# Patient Record
Sex: Male | Born: 1940 | Race: White | Hispanic: No | Marital: Married | Smoking: Former smoker
Health system: Southern US, Community
[De-identification: ages and names within clinical notes are randomized; demographics above are authoritative.]

## PROBLEM LIST (undated history)

## (undated) DIAGNOSIS — E785 Hyperlipidemia, unspecified: Secondary | ICD-10-CM

## (undated) DIAGNOSIS — N189 Chronic kidney disease, unspecified: Secondary | ICD-10-CM

## (undated) DIAGNOSIS — I509 Heart failure, unspecified: Secondary | ICD-10-CM

## (undated) DIAGNOSIS — I10 Essential (primary) hypertension: Secondary | ICD-10-CM

## (undated) DIAGNOSIS — I499 Cardiac arrhythmia, unspecified: Secondary | ICD-10-CM

## (undated) HISTORY — DX: Hyperlipidemia, unspecified: E78.5

## (undated) HISTORY — DX: Heart failure, unspecified: I50.9

## (undated) HISTORY — DX: Cardiac arrhythmia, unspecified: I49.9

## (undated) HISTORY — PX: CHOLECYSTECTOMY: SHX55

## (undated) HISTORY — PX: CATARACT EXTRACTION: SUR2

## (undated) HISTORY — PX: RHINOPLASTY: SUR1284

## (undated) HISTORY — PX: MOUTH SURGERY: SHX715

## (undated) HISTORY — PX: APPENDECTOMY: SHX54

## (undated) HISTORY — DX: Essential (primary) hypertension: I10

## (undated) HISTORY — DX: Chronic kidney disease, unspecified: N18.9

---

## 2001-02-26 DIAGNOSIS — Z860100 Personal history of colon polyps, unspecified: Secondary | ICD-10-CM | POA: Insufficient documentation

## 2002-02-26 HISTORY — PX: CORONARY ARTERY BYPASS GRAFT: SHX141

## 2002-06-22 ENCOUNTER — Ambulatory Visit (HOSPITAL_COMMUNITY): Admission: RE | Admit: 2002-06-22 | Discharge: 2002-06-22 | Payer: Self-pay | Admitting: Ophthalmology

## 2002-06-22 ENCOUNTER — Encounter: Payer: Self-pay | Admitting: Ophthalmology

## 2011-02-28 DIAGNOSIS — Z7901 Long term (current) use of anticoagulants: Secondary | ICD-10-CM | POA: Diagnosis not present

## 2011-03-02 DIAGNOSIS — F329 Major depressive disorder, single episode, unspecified: Secondary | ICD-10-CM | POA: Diagnosis not present

## 2011-03-02 DIAGNOSIS — N529 Male erectile dysfunction, unspecified: Secondary | ICD-10-CM | POA: Diagnosis not present

## 2011-03-02 DIAGNOSIS — E119 Type 2 diabetes mellitus without complications: Secondary | ICD-10-CM | POA: Diagnosis not present

## 2011-03-07 DIAGNOSIS — Z136 Encounter for screening for cardiovascular disorders: Secondary | ICD-10-CM | POA: Diagnosis not present

## 2011-03-07 DIAGNOSIS — I700 Atherosclerosis of aorta: Secondary | ICD-10-CM | POA: Diagnosis not present

## 2011-03-30 DIAGNOSIS — G250 Essential tremor: Secondary | ICD-10-CM | POA: Diagnosis not present

## 2011-03-30 DIAGNOSIS — G252 Other specified forms of tremor: Secondary | ICD-10-CM | POA: Diagnosis not present

## 2011-03-30 DIAGNOSIS — E119 Type 2 diabetes mellitus without complications: Secondary | ICD-10-CM | POA: Diagnosis not present

## 2011-03-30 DIAGNOSIS — Z7901 Long term (current) use of anticoagulants: Secondary | ICD-10-CM | POA: Diagnosis not present

## 2011-03-30 DIAGNOSIS — E669 Obesity, unspecified: Secondary | ICD-10-CM | POA: Diagnosis not present

## 2011-04-11 DIAGNOSIS — Z7901 Long term (current) use of anticoagulants: Secondary | ICD-10-CM | POA: Diagnosis not present

## 2011-04-13 DIAGNOSIS — G250 Essential tremor: Secondary | ICD-10-CM | POA: Diagnosis not present

## 2011-04-13 DIAGNOSIS — Z79899 Other long term (current) drug therapy: Secondary | ICD-10-CM | POA: Diagnosis not present

## 2011-04-13 DIAGNOSIS — Z7901 Long term (current) use of anticoagulants: Secondary | ICD-10-CM | POA: Diagnosis not present

## 2011-04-13 DIAGNOSIS — D539 Nutritional anemia, unspecified: Secondary | ICD-10-CM | POA: Diagnosis not present

## 2011-04-13 DIAGNOSIS — G252 Other specified forms of tremor: Secondary | ICD-10-CM | POA: Diagnosis not present

## 2011-04-19 DIAGNOSIS — Z7901 Long term (current) use of anticoagulants: Secondary | ICD-10-CM | POA: Diagnosis not present

## 2011-04-25 DIAGNOSIS — I6789 Other cerebrovascular disease: Secondary | ICD-10-CM | POA: Diagnosis not present

## 2011-04-25 DIAGNOSIS — R4789 Other speech disturbances: Secondary | ICD-10-CM | POA: Diagnosis not present

## 2011-04-25 DIAGNOSIS — R259 Unspecified abnormal involuntary movements: Secondary | ICD-10-CM | POA: Diagnosis not present

## 2011-04-27 DIAGNOSIS — Z7901 Long term (current) use of anticoagulants: Secondary | ICD-10-CM | POA: Diagnosis not present

## 2011-05-24 DIAGNOSIS — I10 Essential (primary) hypertension: Secondary | ICD-10-CM | POA: Diagnosis not present

## 2011-05-24 DIAGNOSIS — G250 Essential tremor: Secondary | ICD-10-CM | POA: Diagnosis not present

## 2011-05-24 DIAGNOSIS — Z7901 Long term (current) use of anticoagulants: Secondary | ICD-10-CM | POA: Diagnosis not present

## 2011-05-24 DIAGNOSIS — G252 Other specified forms of tremor: Secondary | ICD-10-CM | POA: Diagnosis not present

## 2011-05-24 DIAGNOSIS — F329 Major depressive disorder, single episode, unspecified: Secondary | ICD-10-CM | POA: Diagnosis not present

## 2011-06-01 DIAGNOSIS — Z7901 Long term (current) use of anticoagulants: Secondary | ICD-10-CM | POA: Diagnosis not present

## 2011-06-02 DIAGNOSIS — R079 Chest pain, unspecified: Secondary | ICD-10-CM | POA: Diagnosis not present

## 2011-06-02 DIAGNOSIS — K219 Gastro-esophageal reflux disease without esophagitis: Secondary | ICD-10-CM | POA: Diagnosis not present

## 2011-06-02 DIAGNOSIS — E785 Hyperlipidemia, unspecified: Secondary | ICD-10-CM | POA: Diagnosis present

## 2011-06-02 DIAGNOSIS — E119 Type 2 diabetes mellitus without complications: Secondary | ICD-10-CM | POA: Diagnosis not present

## 2011-06-02 DIAGNOSIS — I119 Hypertensive heart disease without heart failure: Secondary | ICD-10-CM | POA: Diagnosis not present

## 2011-06-02 DIAGNOSIS — I10 Essential (primary) hypertension: Secondary | ICD-10-CM | POA: Diagnosis not present

## 2011-06-02 DIAGNOSIS — Z951 Presence of aortocoronary bypass graft: Secondary | ICD-10-CM | POA: Diagnosis not present

## 2011-06-02 DIAGNOSIS — Z7982 Long term (current) use of aspirin: Secondary | ICD-10-CM | POA: Diagnosis not present

## 2011-06-02 DIAGNOSIS — R072 Precordial pain: Secondary | ICD-10-CM | POA: Diagnosis not present

## 2011-06-02 DIAGNOSIS — Z7901 Long term (current) use of anticoagulants: Secondary | ICD-10-CM | POA: Diagnosis not present

## 2011-06-02 DIAGNOSIS — E7800 Pure hypercholesterolemia, unspecified: Secondary | ICD-10-CM | POA: Diagnosis present

## 2011-06-02 DIAGNOSIS — I4891 Unspecified atrial fibrillation: Secondary | ICD-10-CM | POA: Diagnosis not present

## 2011-06-02 DIAGNOSIS — R51 Headache: Secondary | ICD-10-CM | POA: Diagnosis not present

## 2011-06-02 DIAGNOSIS — J189 Pneumonia, unspecified organism: Secondary | ICD-10-CM | POA: Diagnosis not present

## 2011-06-02 DIAGNOSIS — E669 Obesity, unspecified: Secondary | ICD-10-CM | POA: Diagnosis present

## 2011-06-02 DIAGNOSIS — F329 Major depressive disorder, single episode, unspecified: Secondary | ICD-10-CM | POA: Diagnosis present

## 2011-06-02 DIAGNOSIS — J9600 Acute respiratory failure, unspecified whether with hypoxia or hypercapnia: Secondary | ICD-10-CM | POA: Diagnosis not present

## 2011-06-02 DIAGNOSIS — N039 Chronic nephritic syndrome with unspecified morphologic changes: Secondary | ICD-10-CM | POA: Diagnosis not present

## 2011-06-02 DIAGNOSIS — I2510 Atherosclerotic heart disease of native coronary artery without angina pectoris: Secondary | ICD-10-CM | POA: Diagnosis not present

## 2011-06-02 DIAGNOSIS — Z23 Encounter for immunization: Secondary | ICD-10-CM | POA: Diagnosis not present

## 2011-06-11 DIAGNOSIS — I4891 Unspecified atrial fibrillation: Secondary | ICD-10-CM | POA: Diagnosis not present

## 2011-06-11 DIAGNOSIS — I10 Essential (primary) hypertension: Secondary | ICD-10-CM | POA: Diagnosis not present

## 2011-06-11 DIAGNOSIS — Z7901 Long term (current) use of anticoagulants: Secondary | ICD-10-CM | POA: Diagnosis not present

## 2011-06-11 DIAGNOSIS — J189 Pneumonia, unspecified organism: Secondary | ICD-10-CM | POA: Diagnosis not present

## 2011-06-26 DIAGNOSIS — I951 Orthostatic hypotension: Secondary | ICD-10-CM | POA: Diagnosis not present

## 2011-07-27 DIAGNOSIS — Z7901 Long term (current) use of anticoagulants: Secondary | ICD-10-CM | POA: Diagnosis not present

## 2011-07-31 DIAGNOSIS — E119 Type 2 diabetes mellitus without complications: Secondary | ICD-10-CM | POA: Diagnosis not present

## 2011-07-31 DIAGNOSIS — I10 Essential (primary) hypertension: Secondary | ICD-10-CM | POA: Diagnosis not present

## 2011-08-14 DIAGNOSIS — E669 Obesity, unspecified: Secondary | ICD-10-CM | POA: Diagnosis not present

## 2011-08-14 DIAGNOSIS — E119 Type 2 diabetes mellitus without complications: Secondary | ICD-10-CM | POA: Diagnosis not present

## 2011-08-14 DIAGNOSIS — Z7901 Long term (current) use of anticoagulants: Secondary | ICD-10-CM | POA: Diagnosis not present

## 2011-08-14 DIAGNOSIS — E7800 Pure hypercholesterolemia, unspecified: Secondary | ICD-10-CM | POA: Diagnosis not present

## 2011-08-14 DIAGNOSIS — I10 Essential (primary) hypertension: Secondary | ICD-10-CM | POA: Diagnosis not present

## 2011-08-15 DIAGNOSIS — Z79899 Other long term (current) drug therapy: Secondary | ICD-10-CM | POA: Diagnosis not present

## 2011-09-04 DIAGNOSIS — Z7901 Long term (current) use of anticoagulants: Secondary | ICD-10-CM | POA: Diagnosis not present

## 2011-09-04 DIAGNOSIS — E7800 Pure hypercholesterolemia, unspecified: Secondary | ICD-10-CM | POA: Diagnosis not present

## 2011-10-09 DIAGNOSIS — L570 Actinic keratosis: Secondary | ICD-10-CM | POA: Diagnosis not present

## 2011-10-15 DIAGNOSIS — E119 Type 2 diabetes mellitus without complications: Secondary | ICD-10-CM | POA: Diagnosis not present

## 2011-10-15 DIAGNOSIS — Z7901 Long term (current) use of anticoagulants: Secondary | ICD-10-CM | POA: Diagnosis not present

## 2011-10-15 DIAGNOSIS — I10 Essential (primary) hypertension: Secondary | ICD-10-CM | POA: Diagnosis not present

## 2011-11-15 DIAGNOSIS — R945 Abnormal results of liver function studies: Secondary | ICD-10-CM | POA: Diagnosis not present

## 2011-11-15 DIAGNOSIS — Z23 Encounter for immunization: Secondary | ICD-10-CM | POA: Diagnosis not present

## 2011-11-15 DIAGNOSIS — I10 Essential (primary) hypertension: Secondary | ICD-10-CM | POA: Diagnosis not present

## 2011-11-15 DIAGNOSIS — I4891 Unspecified atrial fibrillation: Secondary | ICD-10-CM | POA: Diagnosis not present

## 2011-11-15 DIAGNOSIS — Z7901 Long term (current) use of anticoagulants: Secondary | ICD-10-CM | POA: Diagnosis not present

## 2011-11-19 DIAGNOSIS — E119 Type 2 diabetes mellitus without complications: Secondary | ICD-10-CM | POA: Diagnosis not present

## 2011-11-19 DIAGNOSIS — H2589 Other age-related cataract: Secondary | ICD-10-CM | POA: Diagnosis not present

## 2011-11-19 DIAGNOSIS — H04129 Dry eye syndrome of unspecified lacrimal gland: Secondary | ICD-10-CM | POA: Diagnosis not present

## 2011-11-21 DIAGNOSIS — L820 Inflamed seborrheic keratosis: Secondary | ICD-10-CM | POA: Diagnosis not present

## 2011-12-31 DIAGNOSIS — Z7901 Long term (current) use of anticoagulants: Secondary | ICD-10-CM | POA: Diagnosis not present

## 2012-01-17 DIAGNOSIS — F329 Major depressive disorder, single episode, unspecified: Secondary | ICD-10-CM | POA: Diagnosis not present

## 2012-01-21 DIAGNOSIS — Z7901 Long term (current) use of anticoagulants: Secondary | ICD-10-CM | POA: Diagnosis not present

## 2012-02-14 DIAGNOSIS — Z7901 Long term (current) use of anticoagulants: Secondary | ICD-10-CM | POA: Diagnosis not present

## 2012-02-22 DIAGNOSIS — Z7901 Long term (current) use of anticoagulants: Secondary | ICD-10-CM | POA: Diagnosis not present

## 2012-03-03 DIAGNOSIS — Z7901 Long term (current) use of anticoagulants: Secondary | ICD-10-CM | POA: Diagnosis not present

## 2012-03-31 DIAGNOSIS — Z7901 Long term (current) use of anticoagulants: Secondary | ICD-10-CM | POA: Diagnosis not present

## 2012-04-28 DIAGNOSIS — J069 Acute upper respiratory infection, unspecified: Secondary | ICD-10-CM | POA: Diagnosis not present

## 2012-04-28 DIAGNOSIS — Z7901 Long term (current) use of anticoagulants: Secondary | ICD-10-CM | POA: Diagnosis not present

## 2012-06-16 DIAGNOSIS — I4891 Unspecified atrial fibrillation: Secondary | ICD-10-CM | POA: Diagnosis not present

## 2012-06-16 DIAGNOSIS — Z7901 Long term (current) use of anticoagulants: Secondary | ICD-10-CM | POA: Diagnosis not present

## 2012-06-30 DIAGNOSIS — I4891 Unspecified atrial fibrillation: Secondary | ICD-10-CM | POA: Diagnosis not present

## 2012-06-30 DIAGNOSIS — Z7901 Long term (current) use of anticoagulants: Secondary | ICD-10-CM | POA: Diagnosis not present

## 2012-07-14 DIAGNOSIS — I4891 Unspecified atrial fibrillation: Secondary | ICD-10-CM | POA: Diagnosis not present

## 2012-07-14 DIAGNOSIS — Z7901 Long term (current) use of anticoagulants: Secondary | ICD-10-CM | POA: Diagnosis not present

## 2012-07-29 DIAGNOSIS — L570 Actinic keratosis: Secondary | ICD-10-CM | POA: Diagnosis not present

## 2012-07-29 DIAGNOSIS — D485 Neoplasm of uncertain behavior of skin: Secondary | ICD-10-CM | POA: Diagnosis not present

## 2012-07-29 DIAGNOSIS — L538 Other specified erythematous conditions: Secondary | ICD-10-CM | POA: Diagnosis not present

## 2012-08-25 DIAGNOSIS — I10 Essential (primary) hypertension: Secondary | ICD-10-CM | POA: Diagnosis not present

## 2012-08-25 DIAGNOSIS — E119 Type 2 diabetes mellitus without complications: Secondary | ICD-10-CM | POA: Diagnosis not present

## 2012-08-25 DIAGNOSIS — F329 Major depressive disorder, single episode, unspecified: Secondary | ICD-10-CM | POA: Diagnosis not present

## 2012-08-25 DIAGNOSIS — Z7901 Long term (current) use of anticoagulants: Secondary | ICD-10-CM | POA: Diagnosis not present

## 2012-08-25 DIAGNOSIS — Z79899 Other long term (current) drug therapy: Secondary | ICD-10-CM | POA: Diagnosis not present

## 2012-08-25 DIAGNOSIS — E7800 Pure hypercholesterolemia, unspecified: Secondary | ICD-10-CM | POA: Diagnosis not present

## 2012-08-25 DIAGNOSIS — Z125 Encounter for screening for malignant neoplasm of prostate: Secondary | ICD-10-CM | POA: Diagnosis not present

## 2012-08-28 DIAGNOSIS — I4891 Unspecified atrial fibrillation: Secondary | ICD-10-CM | POA: Diagnosis not present

## 2012-08-28 DIAGNOSIS — Z7901 Long term (current) use of anticoagulants: Secondary | ICD-10-CM | POA: Diagnosis not present

## 2012-09-04 DIAGNOSIS — I4891 Unspecified atrial fibrillation: Secondary | ICD-10-CM | POA: Diagnosis not present

## 2012-09-04 DIAGNOSIS — Z7901 Long term (current) use of anticoagulants: Secondary | ICD-10-CM | POA: Diagnosis not present

## 2012-09-11 DIAGNOSIS — Z7901 Long term (current) use of anticoagulants: Secondary | ICD-10-CM | POA: Diagnosis not present

## 2012-09-23 DIAGNOSIS — R197 Diarrhea, unspecified: Secondary | ICD-10-CM | POA: Diagnosis not present

## 2012-09-23 DIAGNOSIS — K591 Functional diarrhea: Secondary | ICD-10-CM | POA: Diagnosis not present

## 2012-09-26 DIAGNOSIS — K8020 Calculus of gallbladder without cholecystitis without obstruction: Secondary | ICD-10-CM | POA: Diagnosis not present

## 2012-09-26 DIAGNOSIS — K7689 Other specified diseases of liver: Secondary | ICD-10-CM | POA: Diagnosis not present

## 2012-09-26 DIAGNOSIS — R748 Abnormal levels of other serum enzymes: Secondary | ICD-10-CM | POA: Diagnosis not present

## 2012-09-26 DIAGNOSIS — R197 Diarrhea, unspecified: Secondary | ICD-10-CM | POA: Diagnosis not present

## 2012-10-06 DIAGNOSIS — Z7901 Long term (current) use of anticoagulants: Secondary | ICD-10-CM | POA: Diagnosis not present

## 2012-10-06 DIAGNOSIS — I4891 Unspecified atrial fibrillation: Secondary | ICD-10-CM | POA: Diagnosis not present

## 2012-10-31 DIAGNOSIS — Z7901 Long term (current) use of anticoagulants: Secondary | ICD-10-CM | POA: Diagnosis not present

## 2012-11-27 DIAGNOSIS — Z7901 Long term (current) use of anticoagulants: Secondary | ICD-10-CM | POA: Diagnosis not present

## 2012-11-28 DIAGNOSIS — Z23 Encounter for immunization: Secondary | ICD-10-CM | POA: Diagnosis not present

## 2012-12-11 DIAGNOSIS — K591 Functional diarrhea: Secondary | ICD-10-CM | POA: Diagnosis not present

## 2012-12-22 DIAGNOSIS — Z7901 Long term (current) use of anticoagulants: Secondary | ICD-10-CM | POA: Diagnosis not present

## 2013-01-19 DIAGNOSIS — Z7901 Long term (current) use of anticoagulants: Secondary | ICD-10-CM | POA: Diagnosis not present

## 2013-02-02 DIAGNOSIS — J209 Acute bronchitis, unspecified: Secondary | ICD-10-CM | POA: Diagnosis not present

## 2013-02-23 DIAGNOSIS — Z7901 Long term (current) use of anticoagulants: Secondary | ICD-10-CM | POA: Diagnosis not present

## 2013-03-23 DIAGNOSIS — Z86010 Personal history of colonic polyps: Secondary | ICD-10-CM | POA: Diagnosis not present

## 2013-03-23 DIAGNOSIS — E119 Type 2 diabetes mellitus without complications: Secondary | ICD-10-CM | POA: Diagnosis not present

## 2013-03-23 DIAGNOSIS — K648 Other hemorrhoids: Secondary | ICD-10-CM | POA: Diagnosis not present

## 2013-03-23 DIAGNOSIS — I2510 Atherosclerotic heart disease of native coronary artery without angina pectoris: Secondary | ICD-10-CM | POA: Diagnosis not present

## 2013-03-23 DIAGNOSIS — I119 Hypertensive heart disease without heart failure: Secondary | ICD-10-CM | POA: Diagnosis not present

## 2013-03-23 DIAGNOSIS — K591 Functional diarrhea: Secondary | ICD-10-CM | POA: Diagnosis not present

## 2013-03-23 DIAGNOSIS — Z951 Presence of aortocoronary bypass graft: Secondary | ICD-10-CM | POA: Diagnosis not present

## 2013-03-23 DIAGNOSIS — K219 Gastro-esophageal reflux disease without esophagitis: Secondary | ICD-10-CM | POA: Diagnosis not present

## 2013-03-23 DIAGNOSIS — Z7901 Long term (current) use of anticoagulants: Secondary | ICD-10-CM | POA: Diagnosis not present

## 2013-03-23 DIAGNOSIS — K5730 Diverticulosis of large intestine without perforation or abscess without bleeding: Secondary | ICD-10-CM | POA: Diagnosis not present

## 2013-03-23 DIAGNOSIS — D126 Benign neoplasm of colon, unspecified: Secondary | ICD-10-CM | POA: Diagnosis not present

## 2013-03-23 DIAGNOSIS — Z1211 Encounter for screening for malignant neoplasm of colon: Secondary | ICD-10-CM | POA: Diagnosis not present

## 2013-03-23 DIAGNOSIS — I4891 Unspecified atrial fibrillation: Secondary | ICD-10-CM | POA: Diagnosis not present

## 2013-03-23 DIAGNOSIS — Z79899 Other long term (current) drug therapy: Secondary | ICD-10-CM | POA: Diagnosis not present

## 2013-03-30 DIAGNOSIS — Z7901 Long term (current) use of anticoagulants: Secondary | ICD-10-CM | POA: Diagnosis not present

## 2013-03-30 DIAGNOSIS — I4891 Unspecified atrial fibrillation: Secondary | ICD-10-CM | POA: Diagnosis not present

## 2013-04-08 DIAGNOSIS — Z7901 Long term (current) use of anticoagulants: Secondary | ICD-10-CM | POA: Diagnosis not present

## 2013-04-15 DIAGNOSIS — Z7901 Long term (current) use of anticoagulants: Secondary | ICD-10-CM | POA: Diagnosis not present

## 2013-04-28 DIAGNOSIS — Z7901 Long term (current) use of anticoagulants: Secondary | ICD-10-CM | POA: Diagnosis not present

## 2013-05-17 DIAGNOSIS — T1490XA Injury, unspecified, initial encounter: Secondary | ICD-10-CM | POA: Diagnosis not present

## 2013-05-17 DIAGNOSIS — W010XXA Fall on same level from slipping, tripping and stumbling without subsequent striking against object, initial encounter: Secondary | ICD-10-CM | POA: Diagnosis not present

## 2013-05-17 DIAGNOSIS — S298XXA Other specified injuries of thorax, initial encounter: Secondary | ICD-10-CM | POA: Diagnosis not present

## 2013-05-17 DIAGNOSIS — S3981XA Other specified injuries of abdomen, initial encounter: Secondary | ICD-10-CM | POA: Diagnosis not present

## 2013-05-17 DIAGNOSIS — T07XXXA Unspecified multiple injuries, initial encounter: Secondary | ICD-10-CM | POA: Diagnosis not present

## 2013-05-17 DIAGNOSIS — S46909A Unspecified injury of unspecified muscle, fascia and tendon at shoulder and upper arm level, unspecified arm, initial encounter: Secondary | ICD-10-CM | POA: Diagnosis not present

## 2013-05-17 DIAGNOSIS — S0990XA Unspecified injury of head, initial encounter: Secondary | ICD-10-CM | POA: Diagnosis not present

## 2013-05-17 DIAGNOSIS — R109 Unspecified abdominal pain: Secondary | ICD-10-CM | POA: Diagnosis not present

## 2013-05-17 DIAGNOSIS — IMO0002 Reserved for concepts with insufficient information to code with codable children: Secondary | ICD-10-CM | POA: Diagnosis not present

## 2013-05-17 DIAGNOSIS — R4182 Altered mental status, unspecified: Secondary | ICD-10-CM | POA: Diagnosis not present

## 2013-05-17 DIAGNOSIS — M25519 Pain in unspecified shoulder: Secondary | ICD-10-CM | POA: Diagnosis not present

## 2013-05-17 DIAGNOSIS — S4980XA Other specified injuries of shoulder and upper arm, unspecified arm, initial encounter: Secondary | ICD-10-CM | POA: Diagnosis not present

## 2013-05-17 DIAGNOSIS — S8010XA Contusion of unspecified lower leg, initial encounter: Secondary | ICD-10-CM | POA: Diagnosis not present

## 2013-05-19 DIAGNOSIS — R262 Difficulty in walking, not elsewhere classified: Secondary | ICD-10-CM | POA: Diagnosis not present

## 2013-05-19 DIAGNOSIS — F3289 Other specified depressive episodes: Secondary | ICD-10-CM | POA: Diagnosis present

## 2013-05-19 DIAGNOSIS — I2510 Atherosclerotic heart disease of native coronary artery without angina pectoris: Secondary | ICD-10-CM | POA: Diagnosis present

## 2013-05-19 DIAGNOSIS — IMO0002 Reserved for concepts with insufficient information to code with codable children: Secondary | ICD-10-CM | POA: Diagnosis present

## 2013-05-19 DIAGNOSIS — R799 Abnormal finding of blood chemistry, unspecified: Secondary | ICD-10-CM | POA: Diagnosis not present

## 2013-05-19 DIAGNOSIS — J9819 Other pulmonary collapse: Secondary | ICD-10-CM | POA: Diagnosis not present

## 2013-05-19 DIAGNOSIS — D649 Anemia, unspecified: Secondary | ICD-10-CM | POA: Diagnosis not present

## 2013-05-19 DIAGNOSIS — J9600 Acute respiratory failure, unspecified whether with hypoxia or hypercapnia: Secondary | ICD-10-CM | POA: Diagnosis not present

## 2013-05-19 DIAGNOSIS — R042 Hemoptysis: Secondary | ICD-10-CM | POA: Diagnosis not present

## 2013-05-19 DIAGNOSIS — S27329A Contusion of lung, unspecified, initial encounter: Secondary | ICD-10-CM | POA: Diagnosis not present

## 2013-05-19 DIAGNOSIS — K219 Gastro-esophageal reflux disease without esophagitis: Secondary | ICD-10-CM | POA: Diagnosis present

## 2013-05-19 DIAGNOSIS — I5030 Unspecified diastolic (congestive) heart failure: Secondary | ICD-10-CM | POA: Diagnosis present

## 2013-05-19 DIAGNOSIS — I4891 Unspecified atrial fibrillation: Secondary | ICD-10-CM | POA: Diagnosis present

## 2013-05-19 DIAGNOSIS — E119 Type 2 diabetes mellitus without complications: Secondary | ICD-10-CM | POA: Diagnosis present

## 2013-05-19 DIAGNOSIS — I4892 Unspecified atrial flutter: Secondary | ICD-10-CM | POA: Diagnosis present

## 2013-05-19 DIAGNOSIS — J40 Bronchitis, not specified as acute or chronic: Secondary | ICD-10-CM | POA: Diagnosis not present

## 2013-05-19 DIAGNOSIS — N179 Acute kidney failure, unspecified: Secondary | ICD-10-CM | POA: Diagnosis not present

## 2013-05-19 DIAGNOSIS — E7800 Pure hypercholesterolemia, unspecified: Secondary | ICD-10-CM | POA: Diagnosis present

## 2013-05-19 DIAGNOSIS — S298XXA Other specified injuries of thorax, initial encounter: Secondary | ICD-10-CM | POA: Diagnosis not present

## 2013-05-19 DIAGNOSIS — I25810 Atherosclerosis of coronary artery bypass graft(s) without angina pectoris: Secondary | ICD-10-CM | POA: Diagnosis present

## 2013-05-19 DIAGNOSIS — I252 Old myocardial infarction: Secondary | ICD-10-CM | POA: Diagnosis not present

## 2013-05-19 DIAGNOSIS — I509 Heart failure, unspecified: Secondary | ICD-10-CM | POA: Diagnosis present

## 2013-05-19 DIAGNOSIS — I4949 Other premature depolarization: Secondary | ICD-10-CM | POA: Diagnosis not present

## 2013-05-19 DIAGNOSIS — F329 Major depressive disorder, single episode, unspecified: Secondary | ICD-10-CM | POA: Diagnosis present

## 2013-05-19 DIAGNOSIS — Z7901 Long term (current) use of anticoagulants: Secondary | ICD-10-CM | POA: Diagnosis not present

## 2013-05-19 DIAGNOSIS — R070 Pain in throat: Secondary | ICD-10-CM | POA: Diagnosis not present

## 2013-05-19 DIAGNOSIS — Z79899 Other long term (current) drug therapy: Secondary | ICD-10-CM | POA: Diagnosis not present

## 2013-05-19 DIAGNOSIS — S3981XA Other specified injuries of abdomen, initial encounter: Secondary | ICD-10-CM | POA: Diagnosis not present

## 2013-05-19 DIAGNOSIS — S0990XA Unspecified injury of head, initial encounter: Secondary | ICD-10-CM | POA: Diagnosis not present

## 2013-05-19 DIAGNOSIS — I10 Essential (primary) hypertension: Secondary | ICD-10-CM | POA: Diagnosis present

## 2013-05-26 DIAGNOSIS — J90 Pleural effusion, not elsewhere classified: Secondary | ICD-10-CM | POA: Diagnosis not present

## 2013-05-26 DIAGNOSIS — I10 Essential (primary) hypertension: Secondary | ICD-10-CM | POA: Diagnosis not present

## 2013-05-26 DIAGNOSIS — J811 Chronic pulmonary edema: Secondary | ICD-10-CM | POA: Diagnosis not present

## 2013-05-26 DIAGNOSIS — IMO0001 Reserved for inherently not codable concepts without codable children: Secondary | ICD-10-CM | POA: Diagnosis not present

## 2013-05-26 DIAGNOSIS — Z79899 Other long term (current) drug therapy: Secondary | ICD-10-CM | POA: Diagnosis not present

## 2013-05-26 DIAGNOSIS — E7800 Pure hypercholesterolemia, unspecified: Secondary | ICD-10-CM | POA: Diagnosis not present

## 2013-05-26 DIAGNOSIS — I4891 Unspecified atrial fibrillation: Secondary | ICD-10-CM | POA: Diagnosis not present

## 2013-05-26 DIAGNOSIS — S27329A Contusion of lung, unspecified, initial encounter: Secondary | ICD-10-CM | POA: Diagnosis not present

## 2013-06-03 DIAGNOSIS — R0902 Hypoxemia: Secondary | ICD-10-CM | POA: Diagnosis not present

## 2013-06-03 DIAGNOSIS — Z7901 Long term (current) use of anticoagulants: Secondary | ICD-10-CM | POA: Diagnosis not present

## 2013-06-03 DIAGNOSIS — S27329A Contusion of lung, unspecified, initial encounter: Secondary | ICD-10-CM | POA: Diagnosis not present

## 2013-06-03 DIAGNOSIS — I5031 Acute diastolic (congestive) heart failure: Secondary | ICD-10-CM | POA: Diagnosis not present

## 2013-06-03 DIAGNOSIS — Z79899 Other long term (current) drug therapy: Secondary | ICD-10-CM | POA: Diagnosis not present

## 2013-06-03 DIAGNOSIS — R042 Hemoptysis: Secondary | ICD-10-CM | POA: Diagnosis not present

## 2013-06-03 DIAGNOSIS — I4891 Unspecified atrial fibrillation: Secondary | ICD-10-CM | POA: Diagnosis not present

## 2013-06-08 DIAGNOSIS — E876 Hypokalemia: Secondary | ICD-10-CM | POA: Diagnosis not present

## 2013-06-08 DIAGNOSIS — D649 Anemia, unspecified: Secondary | ICD-10-CM | POA: Diagnosis not present

## 2013-06-08 DIAGNOSIS — E119 Type 2 diabetes mellitus without complications: Secondary | ICD-10-CM | POA: Diagnosis not present

## 2013-06-25 DIAGNOSIS — I2510 Atherosclerotic heart disease of native coronary artery without angina pectoris: Secondary | ICD-10-CM | POA: Diagnosis not present

## 2013-06-25 DIAGNOSIS — R55 Syncope and collapse: Secondary | ICD-10-CM | POA: Diagnosis not present

## 2013-06-25 DIAGNOSIS — R0602 Shortness of breath: Secondary | ICD-10-CM | POA: Diagnosis not present

## 2013-06-25 DIAGNOSIS — I4891 Unspecified atrial fibrillation: Secondary | ICD-10-CM | POA: Diagnosis not present

## 2013-06-25 DIAGNOSIS — I5032 Chronic diastolic (congestive) heart failure: Secondary | ICD-10-CM | POA: Diagnosis not present

## 2013-06-25 DIAGNOSIS — Z79899 Other long term (current) drug therapy: Secondary | ICD-10-CM | POA: Diagnosis not present

## 2013-08-25 DIAGNOSIS — Z7901 Long term (current) use of anticoagulants: Secondary | ICD-10-CM | POA: Diagnosis not present

## 2013-08-25 DIAGNOSIS — I517 Cardiomegaly: Secondary | ICD-10-CM | POA: Diagnosis not present

## 2013-08-25 DIAGNOSIS — I5032 Chronic diastolic (congestive) heart failure: Secondary | ICD-10-CM | POA: Diagnosis not present

## 2013-08-25 DIAGNOSIS — I509 Heart failure, unspecified: Secondary | ICD-10-CM | POA: Diagnosis not present

## 2013-08-25 DIAGNOSIS — I4891 Unspecified atrial fibrillation: Secondary | ICD-10-CM | POA: Diagnosis not present

## 2013-08-25 DIAGNOSIS — I110 Hypertensive heart disease with heart failure: Secondary | ICD-10-CM | POA: Diagnosis not present

## 2013-08-25 DIAGNOSIS — Z79899 Other long term (current) drug therapy: Secondary | ICD-10-CM | POA: Diagnosis not present

## 2013-08-25 DIAGNOSIS — E785 Hyperlipidemia, unspecified: Secondary | ICD-10-CM | POA: Diagnosis not present

## 2013-08-26 DIAGNOSIS — I10 Essential (primary) hypertension: Secondary | ICD-10-CM | POA: Diagnosis not present

## 2013-08-26 DIAGNOSIS — I2510 Atherosclerotic heart disease of native coronary artery without angina pectoris: Secondary | ICD-10-CM | POA: Diagnosis not present

## 2013-08-26 DIAGNOSIS — E119 Type 2 diabetes mellitus without complications: Secondary | ICD-10-CM | POA: Diagnosis not present

## 2013-08-26 DIAGNOSIS — K8020 Calculus of gallbladder without cholecystitis without obstruction: Secondary | ICD-10-CM | POA: Diagnosis not present

## 2013-08-26 DIAGNOSIS — E7800 Pure hypercholesterolemia, unspecified: Secondary | ICD-10-CM | POA: Diagnosis not present

## 2013-08-26 DIAGNOSIS — Z79899 Other long term (current) drug therapy: Secondary | ICD-10-CM | POA: Diagnosis not present

## 2013-08-26 DIAGNOSIS — R1013 Epigastric pain: Secondary | ICD-10-CM | POA: Diagnosis not present

## 2013-08-26 DIAGNOSIS — K219 Gastro-esophageal reflux disease without esophagitis: Secondary | ICD-10-CM | POA: Diagnosis not present

## 2013-09-01 DIAGNOSIS — E669 Obesity, unspecified: Secondary | ICD-10-CM | POA: Diagnosis not present

## 2013-09-01 DIAGNOSIS — Z6833 Body mass index (BMI) 33.0-33.9, adult: Secondary | ICD-10-CM | POA: Diagnosis not present

## 2013-09-01 DIAGNOSIS — K8010 Calculus of gallbladder with chronic cholecystitis without obstruction: Secondary | ICD-10-CM | POA: Diagnosis not present

## 2013-09-04 DIAGNOSIS — K7689 Other specified diseases of liver: Secondary | ICD-10-CM | POA: Diagnosis not present

## 2013-09-04 DIAGNOSIS — K8020 Calculus of gallbladder without cholecystitis without obstruction: Secondary | ICD-10-CM | POA: Diagnosis not present

## 2013-09-04 DIAGNOSIS — R1011 Right upper quadrant pain: Secondary | ICD-10-CM | POA: Diagnosis not present

## 2013-09-08 DIAGNOSIS — L820 Inflamed seborrheic keratosis: Secondary | ICD-10-CM | POA: Diagnosis not present

## 2013-09-09 DIAGNOSIS — I509 Heart failure, unspecified: Secondary | ICD-10-CM | POA: Diagnosis not present

## 2013-09-09 DIAGNOSIS — I5032 Chronic diastolic (congestive) heart failure: Secondary | ICD-10-CM | POA: Diagnosis not present

## 2013-09-09 DIAGNOSIS — Z951 Presence of aortocoronary bypass graft: Secondary | ICD-10-CM | POA: Diagnosis not present

## 2013-09-09 DIAGNOSIS — E119 Type 2 diabetes mellitus without complications: Secondary | ICD-10-CM | POA: Diagnosis not present

## 2013-09-09 DIAGNOSIS — E785 Hyperlipidemia, unspecified: Secondary | ICD-10-CM | POA: Diagnosis not present

## 2013-09-09 DIAGNOSIS — I110 Hypertensive heart disease with heart failure: Secondary | ICD-10-CM | POA: Diagnosis not present

## 2013-09-09 DIAGNOSIS — K219 Gastro-esophageal reflux disease without esophagitis: Secondary | ICD-10-CM | POA: Diagnosis not present

## 2013-09-09 DIAGNOSIS — K8020 Calculus of gallbladder without cholecystitis without obstruction: Secondary | ICD-10-CM | POA: Diagnosis not present

## 2013-09-09 DIAGNOSIS — E669 Obesity, unspecified: Secondary | ICD-10-CM | POA: Diagnosis not present

## 2013-09-09 DIAGNOSIS — Z6833 Body mass index (BMI) 33.0-33.9, adult: Secondary | ICD-10-CM | POA: Diagnosis not present

## 2013-09-09 DIAGNOSIS — K8010 Calculus of gallbladder with chronic cholecystitis without obstruction: Secondary | ICD-10-CM | POA: Diagnosis not present

## 2013-09-09 DIAGNOSIS — I2510 Atherosclerotic heart disease of native coronary artery without angina pectoris: Secondary | ICD-10-CM | POA: Diagnosis not present

## 2013-11-05 DIAGNOSIS — H2589 Other age-related cataract: Secondary | ICD-10-CM | POA: Diagnosis not present

## 2013-11-05 DIAGNOSIS — E119 Type 2 diabetes mellitus without complications: Secondary | ICD-10-CM | POA: Diagnosis not present

## 2013-11-05 DIAGNOSIS — H35349 Macular cyst, hole, or pseudohole, unspecified eye: Secondary | ICD-10-CM | POA: Diagnosis not present

## 2013-11-06 DIAGNOSIS — I2510 Atherosclerotic heart disease of native coronary artery without angina pectoris: Secondary | ICD-10-CM | POA: Diagnosis not present

## 2013-11-06 DIAGNOSIS — E119 Type 2 diabetes mellitus without complications: Secondary | ICD-10-CM | POA: Diagnosis not present

## 2013-11-06 DIAGNOSIS — Z87891 Personal history of nicotine dependence: Secondary | ICD-10-CM | POA: Diagnosis not present

## 2013-11-06 DIAGNOSIS — K219 Gastro-esophageal reflux disease without esophagitis: Secondary | ICD-10-CM | POA: Diagnosis not present

## 2013-11-06 DIAGNOSIS — R9431 Abnormal electrocardiogram [ECG] [EKG]: Secondary | ICD-10-CM | POA: Diagnosis not present

## 2013-11-06 DIAGNOSIS — I10 Essential (primary) hypertension: Secondary | ICD-10-CM | POA: Diagnosis not present

## 2013-11-06 DIAGNOSIS — I509 Heart failure, unspecified: Secondary | ICD-10-CM | POA: Diagnosis not present

## 2013-11-06 DIAGNOSIS — I4891 Unspecified atrial fibrillation: Secondary | ICD-10-CM | POA: Diagnosis not present

## 2013-11-06 DIAGNOSIS — E785 Hyperlipidemia, unspecified: Secondary | ICD-10-CM | POA: Diagnosis not present

## 2013-11-18 DIAGNOSIS — I4891 Unspecified atrial fibrillation: Secondary | ICD-10-CM | POA: Diagnosis not present

## 2013-11-23 DIAGNOSIS — I4891 Unspecified atrial fibrillation: Secondary | ICD-10-CM | POA: Diagnosis not present

## 2013-12-02 DIAGNOSIS — I10 Essential (primary) hypertension: Secondary | ICD-10-CM | POA: Diagnosis not present

## 2013-12-02 DIAGNOSIS — I509 Heart failure, unspecified: Secondary | ICD-10-CM | POA: Diagnosis not present

## 2013-12-02 DIAGNOSIS — I2510 Atherosclerotic heart disease of native coronary artery without angina pectoris: Secondary | ICD-10-CM | POA: Diagnosis not present

## 2013-12-02 DIAGNOSIS — K219 Gastro-esophageal reflux disease without esophagitis: Secondary | ICD-10-CM | POA: Diagnosis not present

## 2013-12-02 DIAGNOSIS — Z79899 Other long term (current) drug therapy: Secondary | ICD-10-CM | POA: Diagnosis not present

## 2013-12-02 DIAGNOSIS — J189 Pneumonia, unspecified organism: Secondary | ICD-10-CM | POA: Diagnosis not present

## 2013-12-02 DIAGNOSIS — E119 Type 2 diabetes mellitus without complications: Secondary | ICD-10-CM | POA: Diagnosis not present

## 2013-12-02 DIAGNOSIS — R0602 Shortness of breath: Secondary | ICD-10-CM | POA: Diagnosis not present

## 2013-12-02 DIAGNOSIS — R918 Other nonspecific abnormal finding of lung field: Secondary | ICD-10-CM | POA: Diagnosis not present

## 2013-12-02 DIAGNOSIS — J159 Unspecified bacterial pneumonia: Secondary | ICD-10-CM | POA: Diagnosis not present

## 2013-12-10 DIAGNOSIS — E780 Pure hypercholesterolemia: Secondary | ICD-10-CM | POA: Diagnosis not present

## 2013-12-10 DIAGNOSIS — J189 Pneumonia, unspecified organism: Secondary | ICD-10-CM | POA: Diagnosis not present

## 2013-12-10 DIAGNOSIS — I10 Essential (primary) hypertension: Secondary | ICD-10-CM | POA: Diagnosis not present

## 2013-12-10 DIAGNOSIS — E119 Type 2 diabetes mellitus without complications: Secondary | ICD-10-CM | POA: Diagnosis not present

## 2013-12-10 DIAGNOSIS — Z23 Encounter for immunization: Secondary | ICD-10-CM | POA: Diagnosis not present

## 2013-12-11 DIAGNOSIS — H2512 Age-related nuclear cataract, left eye: Secondary | ICD-10-CM | POA: Diagnosis not present

## 2014-01-19 DIAGNOSIS — K219 Gastro-esophageal reflux disease without esophagitis: Secondary | ICD-10-CM | POA: Diagnosis not present

## 2014-01-19 DIAGNOSIS — I10 Essential (primary) hypertension: Secondary | ICD-10-CM | POA: Diagnosis not present

## 2014-01-19 DIAGNOSIS — E785 Hyperlipidemia, unspecified: Secondary | ICD-10-CM | POA: Diagnosis not present

## 2014-01-19 DIAGNOSIS — E119 Type 2 diabetes mellitus without complications: Secondary | ICD-10-CM | POA: Diagnosis not present

## 2014-01-19 DIAGNOSIS — I480 Paroxysmal atrial fibrillation: Secondary | ICD-10-CM | POA: Diagnosis not present

## 2014-01-19 DIAGNOSIS — I2510 Atherosclerotic heart disease of native coronary artery without angina pectoris: Secondary | ICD-10-CM | POA: Diagnosis not present

## 2014-03-04 DIAGNOSIS — J189 Pneumonia, unspecified organism: Secondary | ICD-10-CM | POA: Diagnosis not present

## 2014-03-09 DIAGNOSIS — H2512 Age-related nuclear cataract, left eye: Secondary | ICD-10-CM | POA: Diagnosis not present

## 2014-03-09 DIAGNOSIS — K219 Gastro-esophageal reflux disease without esophagitis: Secondary | ICD-10-CM | POA: Diagnosis not present

## 2014-03-09 DIAGNOSIS — H269 Unspecified cataract: Secondary | ICD-10-CM | POA: Diagnosis not present

## 2014-03-09 DIAGNOSIS — I10 Essential (primary) hypertension: Secondary | ICD-10-CM | POA: Diagnosis not present

## 2014-03-09 DIAGNOSIS — E119 Type 2 diabetes mellitus without complications: Secondary | ICD-10-CM | POA: Diagnosis not present

## 2014-03-09 DIAGNOSIS — H25812 Combined forms of age-related cataract, left eye: Secondary | ICD-10-CM | POA: Diagnosis not present

## 2014-03-09 DIAGNOSIS — N400 Enlarged prostate without lower urinary tract symptoms: Secondary | ICD-10-CM | POA: Diagnosis not present

## 2014-03-09 DIAGNOSIS — E785 Hyperlipidemia, unspecified: Secondary | ICD-10-CM | POA: Diagnosis not present

## 2014-03-09 DIAGNOSIS — Z79899 Other long term (current) drug therapy: Secondary | ICD-10-CM | POA: Diagnosis not present

## 2014-03-09 DIAGNOSIS — Z87891 Personal history of nicotine dependence: Secondary | ICD-10-CM | POA: Diagnosis not present

## 2014-03-09 DIAGNOSIS — I4891 Unspecified atrial fibrillation: Secondary | ICD-10-CM | POA: Diagnosis not present

## 2014-07-16 DIAGNOSIS — I10 Essential (primary) hypertension: Secondary | ICD-10-CM | POA: Diagnosis not present

## 2014-07-16 DIAGNOSIS — R0602 Shortness of breath: Secondary | ICD-10-CM | POA: Diagnosis not present

## 2014-07-16 DIAGNOSIS — R079 Chest pain, unspecified: Secondary | ICD-10-CM | POA: Diagnosis not present

## 2014-07-16 DIAGNOSIS — I4891 Unspecified atrial fibrillation: Secondary | ICD-10-CM | POA: Diagnosis not present

## 2014-07-16 DIAGNOSIS — I2510 Atherosclerotic heart disease of native coronary artery without angina pectoris: Secondary | ICD-10-CM | POA: Diagnosis not present

## 2014-07-16 DIAGNOSIS — I480 Paroxysmal atrial fibrillation: Secondary | ICD-10-CM | POA: Diagnosis not present

## 2014-07-16 DIAGNOSIS — I5032 Chronic diastolic (congestive) heart failure: Secondary | ICD-10-CM | POA: Diagnosis not present

## 2014-08-31 DIAGNOSIS — I480 Paroxysmal atrial fibrillation: Secondary | ICD-10-CM | POA: Insufficient documentation

## 2014-08-31 DIAGNOSIS — E785 Hyperlipidemia, unspecified: Secondary | ICD-10-CM | POA: Insufficient documentation

## 2014-08-31 DIAGNOSIS — I10 Essential (primary) hypertension: Secondary | ICD-10-CM | POA: Insufficient documentation

## 2014-08-31 DIAGNOSIS — I5032 Chronic diastolic (congestive) heart failure: Secondary | ICD-10-CM

## 2014-08-31 DIAGNOSIS — I130 Hypertensive heart and chronic kidney disease with heart failure and stage 1 through stage 4 chronic kidney disease, or unspecified chronic kidney disease: Secondary | ICD-10-CM

## 2014-08-31 DIAGNOSIS — Z79899 Other long term (current) drug therapy: Secondary | ICD-10-CM

## 2014-08-31 DIAGNOSIS — I2510 Atherosclerotic heart disease of native coronary artery without angina pectoris: Secondary | ICD-10-CM

## 2014-08-31 DIAGNOSIS — Z7901 Long term (current) use of anticoagulants: Secondary | ICD-10-CM

## 2014-08-31 HISTORY — DX: Chronic diastolic (congestive) heart failure: I50.32

## 2014-08-31 HISTORY — DX: Other long term (current) drug therapy: Z79.899

## 2014-08-31 HISTORY — DX: Long term (current) use of anticoagulants: Z79.01

## 2014-08-31 HISTORY — DX: Paroxysmal atrial fibrillation: I48.0

## 2014-08-31 HISTORY — DX: Atherosclerotic heart disease of native coronary artery without angina pectoris: I25.10

## 2014-08-31 HISTORY — DX: Hypertensive heart and chronic kidney disease with heart failure and stage 1 through stage 4 chronic kidney disease, or unspecified chronic kidney disease: I13.0

## 2014-09-01 DIAGNOSIS — E785 Hyperlipidemia, unspecified: Secondary | ICD-10-CM | POA: Diagnosis not present

## 2014-09-01 DIAGNOSIS — I10 Essential (primary) hypertension: Secondary | ICD-10-CM | POA: Diagnosis not present

## 2014-09-01 DIAGNOSIS — I110 Hypertensive heart disease with heart failure: Secondary | ICD-10-CM | POA: Diagnosis not present

## 2014-09-01 DIAGNOSIS — I480 Paroxysmal atrial fibrillation: Secondary | ICD-10-CM | POA: Diagnosis not present

## 2014-09-01 DIAGNOSIS — I5032 Chronic diastolic (congestive) heart failure: Secondary | ICD-10-CM | POA: Diagnosis not present

## 2014-09-01 DIAGNOSIS — I2510 Atherosclerotic heart disease of native coronary artery without angina pectoris: Secondary | ICD-10-CM | POA: Diagnosis not present

## 2014-10-13 DIAGNOSIS — L219 Seborrheic dermatitis, unspecified: Secondary | ICD-10-CM | POA: Diagnosis not present

## 2014-10-13 DIAGNOSIS — C44722 Squamous cell carcinoma of skin of right lower limb, including hip: Secondary | ICD-10-CM | POA: Diagnosis not present

## 2014-10-13 DIAGNOSIS — L820 Inflamed seborrheic keratosis: Secondary | ICD-10-CM | POA: Diagnosis not present

## 2014-10-13 DIAGNOSIS — L209 Atopic dermatitis, unspecified: Secondary | ICD-10-CM | POA: Diagnosis not present

## 2014-11-08 DIAGNOSIS — E119 Type 2 diabetes mellitus without complications: Secondary | ICD-10-CM | POA: Diagnosis not present

## 2014-12-02 DIAGNOSIS — Z23 Encounter for immunization: Secondary | ICD-10-CM | POA: Diagnosis not present

## 2015-01-13 DIAGNOSIS — L821 Other seborrheic keratosis: Secondary | ICD-10-CM | POA: Diagnosis not present

## 2015-01-13 DIAGNOSIS — L820 Inflamed seborrheic keratosis: Secondary | ICD-10-CM | POA: Diagnosis not present

## 2015-01-13 DIAGNOSIS — L570 Actinic keratosis: Secondary | ICD-10-CM | POA: Diagnosis not present

## 2015-02-07 DIAGNOSIS — E119 Type 2 diabetes mellitus without complications: Secondary | ICD-10-CM | POA: Diagnosis not present

## 2015-02-07 DIAGNOSIS — Z0000 Encounter for general adult medical examination without abnormal findings: Secondary | ICD-10-CM | POA: Diagnosis not present

## 2015-02-07 DIAGNOSIS — E785 Hyperlipidemia, unspecified: Secondary | ICD-10-CM | POA: Diagnosis not present

## 2015-02-07 DIAGNOSIS — E1169 Type 2 diabetes mellitus with other specified complication: Secondary | ICD-10-CM | POA: Diagnosis not present

## 2015-02-07 DIAGNOSIS — R5383 Other fatigue: Secondary | ICD-10-CM | POA: Diagnosis not present

## 2015-02-07 DIAGNOSIS — E669 Obesity, unspecified: Secondary | ICD-10-CM | POA: Diagnosis not present

## 2015-02-07 DIAGNOSIS — Z125 Encounter for screening for malignant neoplasm of prostate: Secondary | ICD-10-CM | POA: Diagnosis not present

## 2015-02-07 DIAGNOSIS — I480 Paroxysmal atrial fibrillation: Secondary | ICD-10-CM | POA: Diagnosis not present

## 2015-02-24 DIAGNOSIS — C4401 Basal cell carcinoma of skin of lip: Secondary | ICD-10-CM | POA: Diagnosis not present

## 2015-04-21 DIAGNOSIS — L820 Inflamed seborrheic keratosis: Secondary | ICD-10-CM | POA: Diagnosis not present

## 2015-04-28 DIAGNOSIS — I110 Hypertensive heart disease with heart failure: Secondary | ICD-10-CM | POA: Diagnosis not present

## 2015-04-28 DIAGNOSIS — Z79899 Other long term (current) drug therapy: Secondary | ICD-10-CM | POA: Diagnosis not present

## 2015-04-28 DIAGNOSIS — I480 Paroxysmal atrial fibrillation: Secondary | ICD-10-CM | POA: Diagnosis not present

## 2015-04-28 DIAGNOSIS — Z7901 Long term (current) use of anticoagulants: Secondary | ICD-10-CM | POA: Diagnosis not present

## 2015-04-28 DIAGNOSIS — I2510 Atherosclerotic heart disease of native coronary artery without angina pectoris: Secondary | ICD-10-CM | POA: Diagnosis not present

## 2015-04-28 DIAGNOSIS — E785 Hyperlipidemia, unspecified: Secondary | ICD-10-CM | POA: Diagnosis not present

## 2015-04-28 DIAGNOSIS — Z951 Presence of aortocoronary bypass graft: Secondary | ICD-10-CM | POA: Insufficient documentation

## 2015-04-28 HISTORY — DX: Presence of aortocoronary bypass graft: Z95.1

## 2015-05-02 DIAGNOSIS — R918 Other nonspecific abnormal finding of lung field: Secondary | ICD-10-CM | POA: Diagnosis not present

## 2015-05-02 DIAGNOSIS — Z79899 Other long term (current) drug therapy: Secondary | ICD-10-CM | POA: Diagnosis not present

## 2015-06-14 DIAGNOSIS — L219 Seborrheic dermatitis, unspecified: Secondary | ICD-10-CM | POA: Diagnosis not present

## 2015-06-14 DIAGNOSIS — L570 Actinic keratosis: Secondary | ICD-10-CM | POA: Diagnosis not present

## 2015-06-27 DIAGNOSIS — E669 Obesity, unspecified: Secondary | ICD-10-CM | POA: Diagnosis not present

## 2015-06-27 DIAGNOSIS — I10 Essential (primary) hypertension: Secondary | ICD-10-CM | POA: Diagnosis not present

## 2015-06-27 DIAGNOSIS — E119 Type 2 diabetes mellitus without complications: Secondary | ICD-10-CM | POA: Diagnosis not present

## 2015-10-11 DIAGNOSIS — H109 Unspecified conjunctivitis: Secondary | ICD-10-CM | POA: Diagnosis not present

## 2015-10-17 DIAGNOSIS — L820 Inflamed seborrheic keratosis: Secondary | ICD-10-CM | POA: Diagnosis not present

## 2015-10-17 DIAGNOSIS — L821 Other seborrheic keratosis: Secondary | ICD-10-CM | POA: Diagnosis not present

## 2015-10-17 DIAGNOSIS — L570 Actinic keratosis: Secondary | ICD-10-CM | POA: Diagnosis not present

## 2015-10-21 DIAGNOSIS — H6122 Impacted cerumen, left ear: Secondary | ICD-10-CM | POA: Diagnosis not present

## 2015-10-21 DIAGNOSIS — J40 Bronchitis, not specified as acute or chronic: Secondary | ICD-10-CM | POA: Diagnosis not present

## 2015-11-22 DIAGNOSIS — E119 Type 2 diabetes mellitus without complications: Secondary | ICD-10-CM | POA: Diagnosis not present

## 2015-11-22 DIAGNOSIS — Z961 Presence of intraocular lens: Secondary | ICD-10-CM | POA: Diagnosis not present

## 2015-11-22 DIAGNOSIS — Z23 Encounter for immunization: Secondary | ICD-10-CM | POA: Diagnosis not present

## 2016-04-18 DIAGNOSIS — D225 Melanocytic nevi of trunk: Secondary | ICD-10-CM | POA: Diagnosis not present

## 2016-04-18 DIAGNOSIS — D1801 Hemangioma of skin and subcutaneous tissue: Secondary | ICD-10-CM | POA: Diagnosis not present

## 2016-04-18 DIAGNOSIS — C44311 Basal cell carcinoma of skin of nose: Secondary | ICD-10-CM | POA: Diagnosis not present

## 2016-04-18 DIAGNOSIS — L820 Inflamed seborrheic keratosis: Secondary | ICD-10-CM | POA: Diagnosis not present

## 2016-04-18 DIAGNOSIS — D485 Neoplasm of uncertain behavior of skin: Secondary | ICD-10-CM | POA: Diagnosis not present

## 2016-05-07 DIAGNOSIS — I2510 Atherosclerotic heart disease of native coronary artery without angina pectoris: Secondary | ICD-10-CM | POA: Diagnosis not present

## 2016-05-07 DIAGNOSIS — Z79899 Other long term (current) drug therapy: Secondary | ICD-10-CM | POA: Diagnosis not present

## 2016-05-07 DIAGNOSIS — E785 Hyperlipidemia, unspecified: Secondary | ICD-10-CM | POA: Diagnosis not present

## 2016-05-07 DIAGNOSIS — Z7901 Long term (current) use of anticoagulants: Secondary | ICD-10-CM | POA: Diagnosis not present

## 2016-05-07 DIAGNOSIS — I480 Paroxysmal atrial fibrillation: Secondary | ICD-10-CM | POA: Diagnosis not present

## 2016-05-07 DIAGNOSIS — I110 Hypertensive heart disease with heart failure: Secondary | ICD-10-CM | POA: Diagnosis not present

## 2016-07-19 DIAGNOSIS — L821 Other seborrheic keratosis: Secondary | ICD-10-CM | POA: Diagnosis not present

## 2016-07-19 DIAGNOSIS — L570 Actinic keratosis: Secondary | ICD-10-CM | POA: Diagnosis not present

## 2016-07-19 DIAGNOSIS — L309 Dermatitis, unspecified: Secondary | ICD-10-CM | POA: Diagnosis not present

## 2016-07-19 DIAGNOSIS — L719 Rosacea, unspecified: Secondary | ICD-10-CM | POA: Diagnosis not present

## 2016-08-18 DIAGNOSIS — I482 Chronic atrial fibrillation: Secondary | ICD-10-CM | POA: Diagnosis not present

## 2016-08-18 DIAGNOSIS — R531 Weakness: Secondary | ICD-10-CM | POA: Diagnosis not present

## 2016-08-27 ENCOUNTER — Encounter: Payer: Self-pay | Admitting: Cardiology

## 2016-08-28 ENCOUNTER — Ambulatory Visit (INDEPENDENT_AMBULATORY_CARE_PROVIDER_SITE_OTHER): Payer: Medicare Other | Admitting: Cardiology

## 2016-08-28 ENCOUNTER — Encounter: Payer: Self-pay | Admitting: Cardiology

## 2016-08-28 VITALS — BP 124/74 | HR 94 | Ht 71.0 in | Wt 250.0 lb

## 2016-08-28 DIAGNOSIS — R002 Palpitations: Secondary | ICD-10-CM | POA: Diagnosis not present

## 2016-08-28 DIAGNOSIS — I491 Atrial premature depolarization: Secondary | ICD-10-CM

## 2016-08-28 DIAGNOSIS — Z79899 Other long term (current) drug therapy: Secondary | ICD-10-CM | POA: Diagnosis not present

## 2016-08-28 DIAGNOSIS — I110 Hypertensive heart disease with heart failure: Secondary | ICD-10-CM

## 2016-08-28 DIAGNOSIS — E785 Hyperlipidemia, unspecified: Secondary | ICD-10-CM | POA: Diagnosis not present

## 2016-08-28 DIAGNOSIS — I25118 Atherosclerotic heart disease of native coronary artery with other forms of angina pectoris: Secondary | ICD-10-CM | POA: Diagnosis not present

## 2016-08-28 HISTORY — DX: Atrial premature depolarization: I49.1

## 2016-08-28 HISTORY — DX: Hyperlipidemia, unspecified: E78.5

## 2016-08-28 NOTE — Patient Instructions (Addendum)
Consider buying Jodelle Red on [address] to record your EKG at home  Medication Instructions:  Your physician recommends that you continue on your current medications as directed. Please refer to the Current Medication list given to you today.   Labwork: Your physician recommends that you return for lab work in: today. Amiodarone level.   Testing/Procedures: Your physician has requested that you have a lexiscan myoview. For further information please visit [email or web address]. Please follow instruction sheet, as given.  Your physician has recommended that you wear a holter monitor. Holter monitors are medical devices that record the heart's electrical activity. Doctors most often use these monitors to diagnose arrhythmias. Arrhythmias are problems with the speed or rhythm of the heartbeat. The monitor is a small, portable device. You can wear one while you do your normal daily activities. This is usually used to diagnose what is causing palpitations/syncope (passing out).    Follow-Up: Your physician recommends that you schedule a follow-up appointment in: 1 month   Any Other Special Instructions Will Be Listed Below (If Applicable).     If you need a refill on your cardiac medications before your next appointment, please call your pharmacy.

## 2016-08-28 NOTE — Progress Notes (Signed)
Cardiology Office Note:    Date:  08/28/2016   ID:  Nathaniel Leach, DOB 07/06/1940, MRN [id number]  PCP:  Street, Sharon Mt, MD  Cardiologist:  Shirlee More, MD    Referring MD: No ref. provider found    ASSESSMENT:    1. On amiodarone therapy   2. APC (atrial premature contractions)   3. Palpitation   4. Coronary artery disease of native artery of native heart with stable angina pectoris ([place])   5. Hypertensive heart disease with heart failure ([place])   6. Dyslipidemia    PLAN:    In order of problems listed above:  1. Continue current dose of amiodarone check level and if inadequate will need an increase in dosage. Liver function is normal recent TSH at the [facility] was normal. 2. Symptomatic Holter monitor be ordered to assess for sinus pauses atrial tachycardia and recurrent atrial fibrillation. If having episodes of atrial fibrillation he is interested in having referral for EP for consultation for ablation. 3. Holter monitor ordered along with an ischemia evaluation at this coronary artery disease and heart failure. 4. At risk for ischemia LV dysfunction stress myocardial perfusion study ordered. 5. Stable blood pressure target assess ejection fraction is diminished will need vasodilator beta blocker therapy and at this time has no volume overload and will continue his current loop diuretic. 6. Stable continue his statin with CAD  Next appointment: One month   Medication Adjustments/Labs and Tests Ordered: Current medicines are reviewed at length with the patient today.  Concerns regarding medicines are outlined above.  Orders Placed This Encounter  Procedures  . Amiodarone level  . Holter monitor - 48 hour  . Myocardial Perfusion Imaging   No orders of the defined types were placed in this encounter.   Chief Complaint  Patient presents with  . Follow-up    Recent ED flup after episodes of heart skipping beats several weeks ago  . Palpitations    Frequent  APC's noted in ED  . Numbness    History of Present Illness:    Nathaniel Leach is a 76 y.o. male with a hx of CAD, CHF, paroxysmal Atrial Fibrillation taking amiodarone, HTN, S/P CABG in 2004 hypertension and hyperlipidemia. He was seen at [facility] ED 08/18/16 with palpitation, frequent APC's. His CbC CMP K were normal,Cr 1.4 troponin undetectable and BNP was low. He had palpitation , SOB and weakness.He continues to palpitation which he describes as forceful beats brief rapid but what bothers him the most sensation causing shortness of breath and weakness. He's had no anginal discomfort orthopnea edema syncope or TIA. He's taking no new prescription medications or over-the-counter proarrhythmic's. Compliance with diet, lifestyle and medications: yes History reviewed. No pertinent past medical history.  Past Surgical History:  Procedure Laterality Date  . APPENDECTOMY    . CATARACT EXTRACTION    . CHOLECYSTECTOMY    . CORONARY ARTERY BYPASS GRAFT  2004  . MOUTH SURGERY    . RHINOPLASTY      Current Medications: Current Meds  Medication Sig  . amiodarone (PACERONE) 200 MG tablet Take 200 mg by mouth 2 (two) times daily. Takes ok Mon, Wed, and Fri  . apixaban (ELIQUIS) 5 MG TABS tablet Take 5 mg by mouth 2 (two) times daily.  Marland Kitchen atorvastatin (LIPITOR) 40 MG tablet Take 80 mg by mouth daily. Takes 1/2 tablet at bedtime  . citalopram (CELEXA) 40 MG tablet Take 40 mg by mouth daily. Takes 1/2 tablet daily  . DOCOSAHEXAENOIC ACID  PO Take 1,200 mg by mouth daily.  . furosemide (LASIX) 40 MG tablet Take 40 mg by mouth 2 (two) times daily.  . metFORMIN (GLUCOPHAGE) 1000 MG tablet Take 500 mg by mouth 2 (two) times daily.  . Multiple Vitamin (MULTIVITAMIN) capsule Take by mouth.  Marland Kitchen omeprazole (PRILOSEC) 20 MG capsule Take 20 mg by mouth daily.  . vitamin E 1000 UNIT capsule Take 1,000 capsules by mouth daily.     Allergies:   Patient has no known allergies.   Social History   Social History    . Marital status: Married    Spouse name: N/A  . Number of children: N/A  . Years of education: N/A   Social History Main Topics  . Smoking status: Former Research scientist (life sciences)  . Smokeless tobacco: Former Systems developer    Types: Chew  . Alcohol use None  . Drug use: Unknown  . Sexual activity: Not Asked   Other Topics Concern  . None   Social History Narrative  . None     Family History: The patient's family history includes CAD in his father; Diabetes in his brother; Stroke in his mother. ROS:   Please see the history of present illness.    All other systems reviewed and are negative.  EKGs/Labs/Other Studies Reviewed:    The following studies were reviewed today: [facility] ED records including EKG labs and physician notes.  EKG:  EKG 08/18/16 showed [place] with frequent and repetitive APC's.   Physical Exam:    VS:  BP 124/74   Pulse 94   Ht 5\' 11"  (1.803 m)   Wt 250 lb (113.4 kg)   SpO2 100%   BMI 34.87 kg/m     Wt Readings from Last 3 Encounters:  08/28/16 250 lb (113.4 kg)     GEN:  Well nourished, well developed in no acute distress HEENT: Normal NECK: No JVD; No carotid bruits LYMPHATICS: No lymphadenopathy CARDIAC: RRR, no murmurs, rubs, gallops RESPIRATORY:  Clear to auscultation without rales, wheezing or rhonchi  ABDOMEN: Soft, non-tender, non-distended MUSCULOSKELETAL:  No edema; No deformity  SKIN: Warm and dry NEUROLOGIC:  Alert and oriented x 3 PSYCHIATRIC:  Normal affect    Signed, Shirlee More, MD  08/28/2016 12:07 PM    [place]

## 2016-09-05 LAB — AMIODARONE LEVEL
AMIODARONE LVL: 1 ug/mL (ref 1.0–2.5)
Noramiodarone,S: 0.7 ug/mL — ABNORMAL LOW (ref 1.0–2.5)

## 2016-09-06 ENCOUNTER — Telehealth (HOSPITAL_COMMUNITY): Payer: Self-pay | Admitting: *Deleted

## 2016-09-06 NOTE — Telephone Encounter (Signed)
Patient given detailed instructions per Myocardial Perfusion Study Information Sheet for the test on 09/10/16 Patient notified to arrive 15 minutes early and that it is imperative to arrive on time for appointment to keep from having the test rescheduled.  If you need to cancel or reschedule your appointment, please call the office within 24 hours of your appointment. . Patient verbalized understanding. Xavien Dauphinais Jacqueline    

## 2016-09-10 ENCOUNTER — Ambulatory Visit (HOSPITAL_COMMUNITY): Payer: Medicare Other | Attending: Cardiovascular Disease

## 2016-09-10 ENCOUNTER — Ambulatory Visit (INDEPENDENT_AMBULATORY_CARE_PROVIDER_SITE_OTHER): Payer: Medicare Other

## 2016-09-10 DIAGNOSIS — I25118 Atherosclerotic heart disease of native coronary artery with other forms of angina pectoris: Secondary | ICD-10-CM | POA: Insufficient documentation

## 2016-09-10 DIAGNOSIS — E119 Type 2 diabetes mellitus without complications: Secondary | ICD-10-CM | POA: Diagnosis not present

## 2016-09-10 DIAGNOSIS — I4891 Unspecified atrial fibrillation: Secondary | ICD-10-CM | POA: Diagnosis not present

## 2016-09-10 DIAGNOSIS — R0602 Shortness of breath: Secondary | ICD-10-CM | POA: Insufficient documentation

## 2016-09-10 DIAGNOSIS — R9439 Abnormal result of other cardiovascular function study: Secondary | ICD-10-CM | POA: Diagnosis not present

## 2016-09-10 DIAGNOSIS — R002 Palpitations: Secondary | ICD-10-CM

## 2016-09-10 DIAGNOSIS — I10 Essential (primary) hypertension: Secondary | ICD-10-CM | POA: Diagnosis not present

## 2016-09-10 DIAGNOSIS — I491 Atrial premature depolarization: Secondary | ICD-10-CM

## 2016-09-10 LAB — MYOCARDIAL PERFUSION IMAGING
CHL CUP NUCLEAR SDS: 3
CHL CUP NUCLEAR SSS: 7
LHR: 0.28
LV dias vol: 87 mL (ref 62–150)
LVSYSVOL: 35 mL
NUC STRESS TID: 0.98
Peak HR: 122 {beats}/min
Rest HR: 76 {beats}/min
SRS: 4

## 2016-09-10 MED ORDER — TECHNETIUM TC 99M TETROFOSMIN IV KIT
32.9000 | PACK | Freq: Once | INTRAVENOUS | Status: AC | PRN
  Administered 2016-09-10: 32.9 via INTRAVENOUS
  Filled 2016-09-10: qty 33

## 2016-09-10 MED ORDER — TECHNETIUM TC 99M TETROFOSMIN IV KIT
10.1000 | PACK | Freq: Once | INTRAVENOUS | Status: AC | PRN
  Administered 2016-09-10: 10.1 via INTRAVENOUS
  Filled 2016-09-10: qty 11

## 2016-09-10 MED ORDER — REGADENOSON 0.4 MG/5ML IV SOLN
0.4000 mg | Freq: Once | INTRAVENOUS | Status: AC
  Administered 2016-09-10: 0.4 mg via INTRAVENOUS

## 2016-10-08 ENCOUNTER — Ambulatory Visit (INDEPENDENT_AMBULATORY_CARE_PROVIDER_SITE_OTHER): Payer: Medicare Other | Admitting: Cardiology

## 2016-10-08 ENCOUNTER — Encounter: Payer: Self-pay | Admitting: Cardiology

## 2016-10-08 VITALS — BP 134/80 | HR 84 | Ht 71.0 in | Wt 250.0 lb

## 2016-10-08 DIAGNOSIS — E785 Hyperlipidemia, unspecified: Secondary | ICD-10-CM

## 2016-10-08 DIAGNOSIS — Z79899 Other long term (current) drug therapy: Secondary | ICD-10-CM | POA: Diagnosis not present

## 2016-10-08 DIAGNOSIS — I480 Paroxysmal atrial fibrillation: Secondary | ICD-10-CM | POA: Diagnosis not present

## 2016-10-08 DIAGNOSIS — I110 Hypertensive heart disease with heart failure: Secondary | ICD-10-CM

## 2016-10-08 DIAGNOSIS — I25118 Atherosclerotic heart disease of native coronary artery with other forms of angina pectoris: Secondary | ICD-10-CM | POA: Diagnosis not present

## 2016-10-08 DIAGNOSIS — I5032 Chronic diastolic (congestive) heart failure: Secondary | ICD-10-CM | POA: Diagnosis not present

## 2016-10-08 NOTE — Progress Notes (Signed)
Cardiology Office Note:    Date:  10/08/2016   ID:  Nathaniel Leach, DOB [birth year], MRN [id number]  PCP:  Street, Sharon Mt, MD  Cardiologist:  Shirlee More, MD    Referring MD: [address] Sharon Mt, *    ASSESSMENT:    1. PAF (paroxysmal atrial fibrillation) ([name])   2. On amiodarone therapy   3. Coronary artery disease of native artery of native heart with stable angina pectoris ([place])   4. Chronic diastolic heart failure ([place])   5. Hypertensive heart disease with heart failure ([place])   6. Dyslipidemia    PLAN:    In order of problems listed above:  1. Improved continue current dose of amiodarone check liver function thyroid for signs of toxicity. Continue his anticoagulant 2. Stable continue amiodarone 3. Stable myocardial perfusion study without evidence of ischemia continue current medical treatment 4. Stable compensated at this time does not require diuretic 5. Stable blood pressure target continue current medical treatment 6. Stable continue statin check liver function for safety lipid profile for efficacy.   Next appointment: 6 months   Medication Adjustments/Labs and Tests Ordered: Current medicines are reviewed at length with the patient today.  Concerns regarding medicines are outlined above.  Orders Placed This Encounter  Procedures  . Comprehensive Metabolic Panel (CMET)  . Lipid Profile  . TSH   No orders of the defined types were placed in this encounter.   Chief Complaint  Patient presents with  . Follow-up    6 week flup appt for PAF    History of Present Illness:    Nathaniel Leach is a 76 y.o. male with a hx of Paroxysmal atrial fibrillation on amiodarone CAD heart failure hypertension and dyslipidemia last seen last month. Compliance with diet, lifestyle and medications: Yes He's had no recurrence of rapid irregular heart rhythm after amiodarone dose was increased with a low therapeutic level. He is pleased with quality of his life has  had no angina dyspnea syncope or TIA. History reviewed. No pertinent past medical history.  Past Surgical History:  Procedure Laterality Date  . APPENDECTOMY    . CATARACT EXTRACTION    . CHOLECYSTECTOMY    . CORONARY ARTERY BYPASS GRAFT  2004  . MOUTH SURGERY    . RHINOPLASTY      Current Medications: Current Meds  Medication Sig  . amiodarone (PACERONE) 200 MG tablet Take 200 mg by mouth 2 (two) times daily.   Marland Kitchen apixaban (ELIQUIS) 5 MG TABS tablet Take 5 mg by mouth 2 (two) times daily.  Marland Kitchen atorvastatin (LIPITOR) 40 MG tablet Take 80 mg by mouth daily. Takes 1/2 tablet at bedtime  . citalopram (CELEXA) 40 MG tablet Take 40 mg by mouth daily. Takes 1/2 tablet daily  . DOCOSAHEXAENOIC ACID PO Take 1,200 mg by mouth daily.  . furosemide (LASIX) 40 MG tablet Take 40 mg by mouth 2 (two) times daily.  . metFORMIN (GLUCOPHAGE) 500 MG tablet Take 500 mg by mouth 2 (two) times daily with a meal.  . Multiple Vitamin (MULTIVITAMIN) capsule Take by mouth.  Marland Kitchen omeprazole (PRILOSEC) 20 MG capsule Take 20 mg by mouth daily.  . vitamin E 400 UNIT capsule Take 400 Units by mouth daily.     Allergies:   Patient has no known allergies.   Social History   Social History  . Marital status: Married    Spouse name: N/A  . Number of children: N/A  . Years of education: N/A   Social History Main  Topics  . Smoking status: Former Research scientist (life sciences)  . Smokeless tobacco: Former Systems developer    Types: Chew  . Alcohol use None  . Drug use: Unknown  . Sexual activity: Not Asked   Other Topics Concern  . None   Social History Narrative  . None     Family History: The patient's family history includes CAD in his father; Diabetes in his brother; Stroke in his mother. ROS:   Please see the history of present illness.    All other systems reviewed and are negative.  EKGs/Labs/Other Studies Reviewed:    The following studies were reviewed today:   Holter monitor 09/10/16 48 Hrs SRTH no PAF Lexiscan MPI  09/10/16: Study Highlights   Nuclear stress EF: 60%.  There was no ST segment deviation noted during stress.  There is a small defect of mild severity present in the basal inferolateral location. The defect is non-reversible.This is consistent with diaphragmatic attenuation artifact. No ischemia noted.  This is a low risk study.  The left ventricular ejection fraction is normal (55-65%).   Recent Labs:Amiodarone level VII/III/MMXVIII amiodarone 1.0 nor amiodarone decrease 0.7 No results found for requested labs within last 8760 hours.  Recent Lipid Panel No results found for: CHOL, TRIG, HDL, CHOLHDL, VLDL, LDLCALC, LDLDIRECT  Physical Exam:    VS:  BP 134/80 (BP Location: Right Arm, Patient Position: Sitting)   Pulse 84   Ht 5\' 11"  (1.803 m)   Wt 250 lb (113.4 kg)   SpO2 96%   BMI 34.87 kg/m     Wt Readings from Last 3 Encounters:  10/08/16 250 lb (113.4 kg)  09/10/16 250 lb (113.4 kg)  08/28/16 250 lb (113.4 kg)     GEN:  Well nourished, well developed in no acute distress HEENT: Normal NECK: No JVD; No carotid bruits LYMPHATICS: No lymphadenopathy CARDIAC: RRR, no murmurs, rubs, gallops RESPIRATORY:  Clear to auscultation without rales, wheezing or rhonchi  ABDOMEN: Soft, non-tender, non-distended MUSCULOSKELETAL:  No edema; No deformity  SKIN: Warm and dry NEUROLOGIC:  Alert and oriented x 3 PSYCHIATRIC:  Normal affect    Signed, Shirlee More, MD  10/08/2016 11:48 AM    [place]

## 2016-10-08 NOTE — Patient Instructions (Addendum)
Medication Instructions:  Your physician recommends that you continue on your current medications as directed. Please refer to the Current Medication list given to you today.   Labwork: Your physician recommends that you return for lab work in: today. TSH, CMP, lipid.   Testing/Procedures: None  Follow-Up: Your physician wants you to follow-up in: 6 months. You will receive a reminder letter in the mail two months in advance. If you don't receive a letter, please call our office to schedule the follow-up appointment.   Any Other Special Instructions Will Be Listed Below (If Applicable).     If you need a refill on your cardiac medications before your next appointment, please call your pharmacy.    Heart Failure  Weigh yourself every morning when you first wake up and record on a calender or note pad, bring this to your office visits. Using a pill tender can help with taking your medications consistently.  Limit your fluid intake to 2 liters daily  Limit your sodium intake to less than 2-3 grams daily. Ask if you need dietary teaching.  If you gain more than 3 pounds (from your dry weight ), double your dose of diuretic for the day.  If you gain more than 5 pounds (from your dry weight), double your dose of lasix and call your heart failure doctor.  Please do not smoke tobacco since it is very bad for your heart.  Please do not drink alcohol since it can worsen your heart failure.Also avoid OTC nonsteroidal drugs, such as advil, aleve and motrin.  Try to exercise for at least 30 minutes every day because this will help your heart be more efficient. You may be eligible for supervised cardiac rehab, ask your physician.

## 2016-10-09 LAB — COMPREHENSIVE METABOLIC PANEL
ALT: 49 IU/L — AB (ref 0–44)
AST: 51 IU/L — ABNORMAL HIGH (ref 0–40)
Albumin/Globulin Ratio: 1.6 (ref 1.2–2.2)
Albumin: 4.6 g/dL (ref 3.5–4.8)
Alkaline Phosphatase: 59 IU/L (ref 39–117)
BUN/Creatinine Ratio: 9 — ABNORMAL LOW (ref 10–24)
BUN: 16 mg/dL (ref 8–27)
Bilirubin Total: 0.8 mg/dL (ref 0.0–1.2)
CALCIUM: 9.3 mg/dL (ref 8.6–10.2)
CO2: 24 mmol/L (ref 20–29)
CREATININE: 1.69 mg/dL — AB (ref 0.76–1.27)
Chloride: 100 mmol/L (ref 96–106)
GFR, EST AFRICAN AMERICAN: 44 mL/min/{1.73_m2} — AB (ref >59)
GFR, EST NON AFRICAN AMERICAN: 38 mL/min/{1.73_m2} — AB (ref >59)
GLUCOSE: 165 mg/dL — AB (ref 65–99)
Globulin, Total: 2.8 g/dL (ref 1.5–4.5)
Potassium: 4.3 mmol/L (ref 3.5–5.2)
Sodium: 141 mmol/L (ref 134–144)
TOTAL PROTEIN: 7.4 g/dL (ref 6.0–8.5)

## 2016-10-09 LAB — LIPID PANEL
CHOL/HDL RATIO: 3.6 ratio (ref 0.0–5.0)
Cholesterol, Total: 128 mg/dL (ref 100–199)
HDL: 36 mg/dL — AB (ref >39)
LDL Calculated: 40 mg/dL (ref 0–99)
Triglycerides: 258 mg/dL — ABNORMAL HIGH (ref 0–149)
VLDL CHOLESTEROL CAL: 52 mg/dL — AB (ref 5–40)

## 2016-10-09 LAB — TSH: TSH: 0.881 u[IU]/mL (ref 0.450–4.500)

## 2016-11-05 DIAGNOSIS — Z23 Encounter for immunization: Secondary | ICD-10-CM | POA: Diagnosis not present

## 2016-11-22 NOTE — Progress Notes (Signed)
Cardiology Office Note:    Date:  11/23/2016   ID:  Nathaniel Leach, DOB 12-27-1940, MRN [id number]  PCP:  Street, Sharon Mt, MD  Cardiologist:  Shirlee More, MD    Referring MD: [address], Sharon Mt, *    ASSESSMENT:    1. Chronic diastolic heart failure ([place])   2. PAF (paroxysmal atrial fibrillation) ([place])   3. Chronic kidney disease, unspecified CKD stage    PLAN:    In order of problems listed above:  1. stable continue current diurcetic 2. Stable continue current antiarrhythmic amiodarone 3. See discussion under history, no longer on metformin.   Next appointment: 6 months   Medication Adjustments/Labs and Tests Ordered: Current medicines are reviewed at length with the patient today.  Concerns regarding medicines are outlined above.  No orders of the defined types were placed in this encounter.  No orders of the defined types were placed in this encounter.   Chief Complaint  Patient presents with  . Follow-up    to discuss medications    History of Present Illness:    Nathaniel Leach is a 76 y.o. male with a hx of Paroxysmal atrial fibrillation on amiodarone CAD heart failure hypertension and dyslipidemia  last seen 10/08/16. Compliance with diet, lifestyle and medications: yes This visit is prompted by recent lab with worsened CK D creatinine 1.86 and discontinuation of metformin. He was placed on sulfonylurea. He is seeks my opinion I told him he be best served with medication like jardiance or  Victoza. He is also worried because he needs to see a cardiologist the [facility] I assured him that we could work together. He's had no recurrent atrial fibrillation edema chest pain palpitations syncope or TIA. I supplied him with a copy of his recent myocardial perfusion study and Holter monitor. History reviewed. No pertinent past medical history.  Past Surgical History:  Procedure Laterality Date  . APPENDECTOMY    . CATARACT EXTRACTION    .  CHOLECYSTECTOMY    . CORONARY ARTERY BYPASS GRAFT  2004  . MOUTH SURGERY    . RHINOPLASTY      Current Medications: Current Meds  Medication Sig  . glipiZIDE (GLUCOTROL) 5 MG tablet Take 5 mg by mouth 2 (two) times daily before a meal.     Allergies:   Patient has no known allergies.   Social History   Social History  . Marital status: Married    Spouse name: N/A  . Number of children: N/A  . Years of education: N/A   Social History Main Topics  . Smoking status: Former Research scientist (life sciences)  . Smokeless tobacco: Former Systems developer    Types: Chew  . Alcohol use None  . Drug use: Unknown  . Sexual activity: Not Asked   Other Topics Concern  . None   Social History Narrative  . None     Family History: The patient's family history includes CAD in his father; Diabetes in his brother; Stroke in his mother. ROS:   Please see the history of present illness.    All other systems reviewed and are negative.  EKGs/Labs/Other Studies Reviewed:    The following studies were reviewed today:  Recent Labs: 10/08/2016: ALT 49; BUN 16; Creatinine, Ser 1.69; Potassium 4.3; Sodium 141; TSH 0.881  Recent Lipid Panel    Component Value Date/Time   CHOL 128 10/08/2016 1110   TRIG 258 (H) 10/08/2016 1110   HDL 36 (L) 10/08/2016 1110   CHOLHDL 3.6 10/08/2016 1110   LDLCALC  40 10/08/2016 1110    Physical Exam:    VS:  BP 126/82 (BP Location: Right Arm, Patient Position: Sitting)   Pulse 77   Ht 5\' 11"  (1.803 m)   Wt 248 lb 12.8 oz (112.9 kg)   SpO2 96%   BMI 34.70 kg/m     Wt Readings from Last 3 Encounters:  11/23/16 248 lb 12.8 oz (112.9 kg)  10/08/16 250 lb (113.4 kg)  09/10/16 250 lb (113.4 kg)     GEN:  Well nourished, well developed in no acute distress HEENT: Normal NECK: No JVD; No carotid bruits LYMPHATICS: No lymphadenopathy CARDIAC: RRR, no murmurs, rubs, gallops RESPIRATORY:  Clear to auscultation without rales, wheezing or rhonchi  ABDOMEN: Soft, non-tender,  non-distended MUSCULOSKELETAL:  No edema; No deformity  SKIN: Warm and dry NEUROLOGIC:  Alert and oriented x 3 PSYCHIATRIC:  Normal affect    Signed, Shirlee More, MD  11/23/2016 11:07 AM    [place]

## 2016-11-23 ENCOUNTER — Ambulatory Visit (INDEPENDENT_AMBULATORY_CARE_PROVIDER_SITE_OTHER): Payer: Medicare Other | Admitting: Cardiology

## 2016-11-23 ENCOUNTER — Encounter: Payer: Self-pay | Admitting: Cardiology

## 2016-11-23 VITALS — BP 126/82 | HR 77 | Ht 71.0 in | Wt 248.8 lb

## 2016-11-23 DIAGNOSIS — N183 Chronic kidney disease, stage 3 unspecified: Secondary | ICD-10-CM

## 2016-11-23 DIAGNOSIS — I5032 Chronic diastolic (congestive) heart failure: Secondary | ICD-10-CM | POA: Diagnosis not present

## 2016-11-23 DIAGNOSIS — N189 Chronic kidney disease, unspecified: Secondary | ICD-10-CM

## 2016-11-23 DIAGNOSIS — I480 Paroxysmal atrial fibrillation: Secondary | ICD-10-CM

## 2016-11-23 HISTORY — DX: Chronic kidney disease, stage 3 unspecified: N18.30

## 2016-11-23 NOTE — Patient Instructions (Signed)

## 2016-12-11 DIAGNOSIS — Z961 Presence of intraocular lens: Secondary | ICD-10-CM | POA: Diagnosis not present

## 2016-12-11 DIAGNOSIS — E119 Type 2 diabetes mellitus without complications: Secondary | ICD-10-CM | POA: Diagnosis not present

## 2016-12-17 ENCOUNTER — Telehealth: Payer: Self-pay | Admitting: Cardiology

## 2016-12-17 NOTE — Telephone Encounter (Signed)
I think he should stay on amiodarone

## 2016-12-17 NOTE — Telephone Encounter (Signed)
Please call patient regarding the meds that the [place] sent him and he needs to know what they may have replaced.. The drug is Metoprolol Tartrate 50 1/2 twice daily ... ALSO is this replacing something and also Dr at [facility] states that patient has been on Amiodorine and wants him to try something different.Marland Kitchen

## 2016-12-17 NOTE — Telephone Encounter (Signed)
Please advise 

## 2016-12-18 NOTE — Telephone Encounter (Signed)
Left message for pt advising he should stay on the amiodarone.

## 2016-12-26 DIAGNOSIS — K641 Second degree hemorrhoids: Secondary | ICD-10-CM | POA: Diagnosis not present

## 2016-12-26 DIAGNOSIS — E119 Type 2 diabetes mellitus without complications: Secondary | ICD-10-CM | POA: Diagnosis not present

## 2016-12-26 DIAGNOSIS — I4891 Unspecified atrial fibrillation: Secondary | ICD-10-CM | POA: Diagnosis not present

## 2016-12-28 DIAGNOSIS — G4733 Obstructive sleep apnea (adult) (pediatric): Secondary | ICD-10-CM | POA: Diagnosis not present

## 2016-12-28 DIAGNOSIS — J4520 Mild intermittent asthma, uncomplicated: Secondary | ICD-10-CM | POA: Diagnosis not present

## 2017-03-12 DIAGNOSIS — C44311 Basal cell carcinoma of skin of nose: Secondary | ICD-10-CM | POA: Diagnosis not present

## 2017-03-26 ENCOUNTER — Telehealth: Payer: Self-pay | Admitting: Cardiology

## 2017-03-26 DIAGNOSIS — I5032 Chronic diastolic (congestive) heart failure: Secondary | ICD-10-CM

## 2017-03-26 NOTE — Telephone Encounter (Signed)
2 days =4 doses of eliquis, usually restart 24-48 hrs after but ask the doctor

## 2017-03-26 NOTE — Telephone Encounter (Signed)
Patient called to ask: in two weeks he is having a skin cancer removed, how long should he stop his blood thinner? Doctor told him to keep an eye on his weight and he has gained 12 pounds in 6 weeks while cutting back and exercising.

## 2017-03-26 NOTE — Telephone Encounter (Signed)
Please advise 

## 2017-03-26 NOTE — Telephone Encounter (Signed)
Patient advised to start taking an extra 40 mg of furosemide daily for one week. Patient will call after one week if no improvement. Patient verbalized understanding. No further questions.

## 2017-03-26 NOTE — Telephone Encounter (Signed)
He can try increasing furosemide 40 mg a day for 1 week

## 2017-03-26 NOTE — Telephone Encounter (Signed)
Advised patient of Eliquis instructions. Patient verbalized understading.  Patient states that he has not cut back on his exercising, he has actually increased his exercise. He has cut back on portion sizes and changed his diet. He also has not had any alcohol in about 2 months, where before he would have 2-4 mixed drinks a day. Patient is concerned he may be retaining fluid. No shortness of breath. Please advise.

## 2017-04-03 NOTE — Telephone Encounter (Signed)
Yes but needs a bmp and bnp

## 2017-04-03 NOTE — Telephone Encounter (Addendum)
Increased dose of furosemide last Tuesday. First 4 days lost 3 pounds, then you started gaining it back, as of today in 1 week only down 1.5 pounds total. Intake has been the same. No swelling more than normal. Patient would like to know if he should continue with double dose of furosemide, or reduce back to original dose. Please advise.

## 2017-04-04 DIAGNOSIS — I5032 Chronic diastolic (congestive) heart failure: Secondary | ICD-10-CM | POA: Diagnosis not present

## 2017-04-04 NOTE — Addendum Note (Signed)
Addended by: Jossie Ng on: 04/04/2017 08:44 AM   Modules accepted: Orders

## 2017-04-04 NOTE — Telephone Encounter (Signed)
Patient advised okay to continue on extra dose of furosemide. Patient advised to go to [facility] in [place] to have BMP and BNP checked. Patient verbalized understanding. No further questions.

## 2017-04-05 LAB — BASIC METABOLIC PANEL
BUN / CREAT RATIO: 10 (ref 10–24)
BUN: 17 mg/dL (ref 8–27)
CHLORIDE: 99 mmol/L (ref 96–106)
CO2: 23 mmol/L (ref 20–29)
Calcium: 9.4 mg/dL (ref 8.6–10.2)
Creatinine, Ser: 1.66 mg/dL — ABNORMAL HIGH (ref 0.76–1.27)
GFR calc Af Amer: 45 mL/min/{1.73_m2} — ABNORMAL LOW (ref >59)
GFR calc non Af Amer: 39 mL/min/{1.73_m2} — ABNORMAL LOW (ref >59)
GLUCOSE: 216 mg/dL — AB (ref 65–99)
POTASSIUM: 4.9 mmol/L (ref 3.5–5.2)
SODIUM: 141 mmol/L (ref 134–144)

## 2017-04-05 LAB — PRO B NATRIURETIC PEPTIDE: NT-PRO BNP: 148 pg/mL (ref 0–486)

## 2017-04-06 DIAGNOSIS — C44311 Basal cell carcinoma of skin of nose: Secondary | ICD-10-CM | POA: Diagnosis not present

## 2017-04-25 DIAGNOSIS — E1169 Type 2 diabetes mellitus with other specified complication: Secondary | ICD-10-CM | POA: Diagnosis not present

## 2017-04-25 DIAGNOSIS — I10 Essential (primary) hypertension: Secondary | ICD-10-CM | POA: Diagnosis not present

## 2017-04-25 DIAGNOSIS — I4891 Unspecified atrial fibrillation: Secondary | ICD-10-CM | POA: Diagnosis not present

## 2017-04-25 DIAGNOSIS — E785 Hyperlipidemia, unspecified: Secondary | ICD-10-CM | POA: Diagnosis not present

## 2017-05-01 NOTE — Progress Notes (Signed)
Cardiology Office Note:    Date:  05/02/2017   ID:  Nathaniel Leach, DOB Mar 29, 1940, MRN [id number]  PCP:  Street, Sharon Mt, MD  Cardiologist:  Shirlee More, MD    Referring MD: [address], Sharon Mt, *    ASSESSMENT:    1. Hypertensive heart disease with heart failure ([place])   2. Chronic diastolic heart failure ([place])   3. Coronary artery disease of native artery of native heart with stable angina pectoris ([place])   4. PAF (paroxysmal atrial fibrillation) ([place])   5. On amiodarone therapy   6. Chronic anticoagulation   7. Hyperlipidemia, unspecified hyperlipidemia type   8. Microalbuminuria    PLAN:    In order of problems listed above:  1. Improved heart failure is compensated continue current diuretic.  With his microalbuminuria I started on a low-dose of an ARB pending primary care follow-up.  Appropriately metformin has been discontinued 2. Stable compensated he is off Metformin continue his current loop diuretic start a low-dose ARB and follow-up labs to be performed at the [facility] in the next few weeks 3. Stable continue current medical treatment he has had no angina 4. Stable no recurrence continue his anticoagulant 5. He is no longer an amiodarone 6. Continue his anticoagulant 7. Continue statin await labs from the [facility] 8. New finding he request that I asked Butch Penny to start him on low-dose of an ARB with follow-up potassium and renal function.   Next appointment: 6 months   Medication Adjustments/Labs and Tests Ordered: Current medicines are reviewed at length with the patient today.  Concerns regarding medicines are outlined above.  No orders of the defined types were placed in this encounter.  Meds ordered this encounter  Medications  . losartan (COZAAR) 50 MG tablet    Sig: Take 1 tablet (50 mg total) by mouth daily.    Dispense:  30 tablet    Refill:  3    Chief Complaint  Patient presents with  . Follow-up    6 month flup appt   .  Congestive Heart Failure  . Coronary Artery Disease    History of Present Illness:    Nathaniel Leach is a 77 y.o. male with a hx of  Paroxysmal atrial fibrillation on amiodarone CAD EF 55-60% CABG 2004  heart failure hypertension and dyslipidemia last seen 11/23/16.he has had increased edema and diuretic was increased by phone.  ASSESSMENT:   11/23/16   1. Chronic diastolic heart failure ([place])   2. PAF (paroxysmal atrial fibrillation) ([place])   3. Chronic kidney disease, unspecified CKD stage    PLAN:    1.   stable continue current diurcetic 9. Stable continue current antiarrhythmic amiodarone 10. See discussion under history, no longer on metformin.   Compliance with diet, lifestyle and medications: Yes  He feels better his edema is resolved is not short of breath no palpitation chest pain TIA or bleeding complication of his anticoagulant.  He shows me lab work including an A1c at target and urine which shows microalbuminuria he asked me to explain the significance and request I initiate therapy with his diabetes and CKD History reviewed. No pertinent past medical history.  Past Surgical History:  Procedure Laterality Date  . APPENDECTOMY    . CATARACT EXTRACTION    . CHOLECYSTECTOMY    . CORONARY ARTERY BYPASS GRAFT  2004  . MOUTH SURGERY    . RHINOPLASTY      Current Medications: Current Meds  Medication Sig  . apixaban (  ELIQUIS) 5 MG TABS tablet Take 5 mg by mouth 2 (two) times daily.  Marland Kitchen atorvastatin (LIPITOR) 40 MG tablet Take 80 mg by mouth daily. Takes 1/2 tablet at bedtime  . citalopram (CELEXA) 40 MG tablet Take 10 mg by mouth daily.   . DOCOSAHEXAENOIC ACID PO Take 1,200 mg by mouth daily.  . furosemide (LASIX) 40 MG tablet Take 60 mg by mouth 2 (two) times daily.   Marland Kitchen glipiZIDE (GLUCOTROL) 5 MG tablet Take 5 mg by mouth 2 (two) times daily before a meal.  . Multiple Vitamin (MULTIVITAMIN) capsule Take by mouth.  Marland Kitchen omeprazole (PRILOSEC) 20 MG capsule Take  20 mg by mouth daily.  . vitamin E 400 UNIT capsule Take 400 Units by mouth daily.     Allergies:   Patient has no known allergies.   Social History   Socioeconomic History  . Marital status: Married    Spouse name: None  . Number of children: None  . Years of education: None  . Highest education level: None  Social Needs  . Financial resource strain: None  . Food insecurity - worry: None  . Food insecurity - inability: None  . Transportation needs - medical: None  . Transportation needs - non-medical: None  Occupational History  . None  Tobacco Use  . Smoking status: Former Research scientist (life sciences)  . Smokeless tobacco: Former Systems developer    Types: Chew  Substance and Sexual Activity  . Alcohol use: None  . Drug use: None  . Sexual activity: None  Other Topics Concern  . None  Social History Narrative  . None     Family History: The patient's family history includes CAD in his father; Diabetes in his brother; Stroke in his mother. ROS:   Please see the history of present illness.    All other systems reviewed and are negative.  EKGs/Labs/Other Studies Reviewed:    The following studies were reviewed today  Recent Labs: 10/08/2016: ALT 49; TSH 0.881 04/04/2017: BUN 17; Creatinine, Ser 1.66; NT-Pro BNP 148; Potassium 4.9; Sodium 141  Recent Lipid Panel    Component Value Date/Time   CHOL 128 10/08/2016 1110   TRIG 258 (H) 10/08/2016 1110   HDL 36 (L) 10/08/2016 1110   CHOLHDL 3.6 10/08/2016 1110   LDLCALC 40 10/08/2016 1110    Physical Exam:    VS:  BP 116/70 (BP Location: Right Arm, Patient Position: Sitting, Cuff Size: Large)   Pulse 83   Ht 5\' 11"  (1.803 m)   Wt 250 lb 12.8 oz (113.8 kg)   SpO2 94%   BMI 34.98 kg/m     Wt Readings from Last 3 Encounters:  05/02/17 250 lb 12.8 oz (113.8 kg)  11/23/16 248 lb 12.8 oz (112.9 kg)  10/08/16 250 lb (113.4 kg)     GEN:  Well nourished, well developed in no acute distress HEENT: Normal NECK: No JVD; No carotid  bruits LYMPHATICS: No lymphadenopathy CARDIAC: RRR, no murmurs, rubs, gallops RESPIRATORY:  Clear to auscultation without rales, wheezing or rhonchi  ABDOMEN: Soft, non-tender, non-distended MUSCULOSKELETAL:  No edema; No deformity  SKIN: Warm and dry NEUROLOGIC:  Alert and oriented x 3 PSYCHIATRIC:  Normal affect    Signed, Shirlee More, MD  05/02/2017 12:42 PM    [facility]

## 2017-05-02 ENCOUNTER — Encounter: Payer: Self-pay | Admitting: Cardiology

## 2017-05-02 ENCOUNTER — Ambulatory Visit (INDEPENDENT_AMBULATORY_CARE_PROVIDER_SITE_OTHER): Payer: Medicare Other | Admitting: Cardiology

## 2017-05-02 ENCOUNTER — Telehealth: Payer: Self-pay | Admitting: *Deleted

## 2017-05-02 VITALS — BP 116/70 | HR 83 | Ht 71.0 in | Wt 250.8 lb

## 2017-05-02 DIAGNOSIS — I110 Hypertensive heart disease with heart failure: Secondary | ICD-10-CM | POA: Diagnosis not present

## 2017-05-02 DIAGNOSIS — E785 Hyperlipidemia, unspecified: Secondary | ICD-10-CM

## 2017-05-02 DIAGNOSIS — Z79899 Other long term (current) drug therapy: Secondary | ICD-10-CM | POA: Diagnosis not present

## 2017-05-02 DIAGNOSIS — Z7901 Long term (current) use of anticoagulants: Secondary | ICD-10-CM

## 2017-05-02 DIAGNOSIS — I5032 Chronic diastolic (congestive) heart failure: Secondary | ICD-10-CM

## 2017-05-02 DIAGNOSIS — I480 Paroxysmal atrial fibrillation: Secondary | ICD-10-CM | POA: Diagnosis not present

## 2017-05-02 DIAGNOSIS — R809 Proteinuria, unspecified: Secondary | ICD-10-CM | POA: Diagnosis not present

## 2017-05-02 DIAGNOSIS — I25118 Atherosclerotic heart disease of native coronary artery with other forms of angina pectoris: Secondary | ICD-10-CM | POA: Diagnosis not present

## 2017-05-02 HISTORY — DX: Proteinuria, unspecified: R80.9

## 2017-05-02 MED ORDER — LOSARTAN POTASSIUM 50 MG PO TABS: 50.0000 mg | ORAL_TABLET | Freq: Every day | ORAL | 3 refills | Status: DC

## 2017-05-02 MED ORDER — ATORVASTATIN CALCIUM 40 MG PO TABS: 80.0000 mg | ORAL_TABLET | Freq: Every day | ORAL | 3 refills | Status: DC

## 2017-05-02 NOTE — Telephone Encounter (Signed)
Patient called asking to send new prescription to [facility] on [address] because [facility] did not have the losartan. Prescription sent. Patient also requested a printed prescription for the [place]. Will send in the mail. No further questions.

## 2017-05-02 NOTE — Patient Instructions (Addendum)
Medication Instructions:  Your physician has recommended you make the following change in your medication:  START losartan 50 mg daily  Labwork: None  Testing/Procedures: None  Follow-Up: Your physician wants you to follow-up in: 6 months. You will receive a reminder letter in the mail two months in advance. If you don't receive a letter, please call our office to schedule the follow-up appointment.   Any Other Special Instructions Will Be Listed Below (If Applicable).     If you need a refill on your cardiac medications before your next appointment, please call your pharmacy. ]  Heart Failure  Weigh yourself every morning when you first wake up and record on a calender or note pad, bring this to your office visits. Using a pill tender can help with taking your medications consistently.  Limit your fluid intake to 2 liters daily  Limit your sodium intake to less than 2-3 grams daily. Ask if you need dietary teaching.  If you gain more than 3 pounds (from your dry weight ), double your dose of diuretic for the day.  If you gain more than 5 pounds (from your dry weight), double your dose of lasix and call your heart failure doctor.  Please do not smoke tobacco since it is very bad for your heart.  Please do not drink alcohol since it can worsen your heart failure.Also avoid OTC nonsteroidal drugs, such as advil, aleve and motrin.  Try to exercise for at least 30 minutes every day because this will help your heart be more efficient. You may be eligible for supervised cardiac rehab, ask your physician.

## 2017-05-13 ENCOUNTER — Telehealth: Payer: Self-pay | Admitting: Cardiology

## 2017-05-13 DIAGNOSIS — R809 Proteinuria, unspecified: Secondary | ICD-10-CM

## 2017-05-13 MED ORDER — LOSARTAN POTASSIUM 50 MG PO TABS: 50.0000 mg | ORAL_TABLET | Freq: Every day | ORAL | 3 refills | Status: DC

## 2017-05-13 NOTE — Telephone Encounter (Signed)
°*  STAT* If patient is at the pharmacy, call can be transferred to refill team.   1. Which medications need to be refilled? (please list name of each medication and dose if known) Losartin 50mg  daily  2. Which pharmacy/location (including street and city if local pharmacy) is medication to be sent to?NEEDS MAILED IMMEDIATELY to take to [place]  3. Do they need a 30 day or 90 day supply? 90   They last script that was sent to him was wrong. Please remail correct AND call patient.

## 2017-05-13 NOTE — Telephone Encounter (Signed)
Confirmed that patient is taking losartan 50 mg daily. Advised patient prescription would be mailed today. Patient verbalized understanding. No further questions.

## 2017-06-13 DIAGNOSIS — L239 Allergic contact dermatitis, unspecified cause: Secondary | ICD-10-CM | POA: Diagnosis not present

## 2017-06-19 DIAGNOSIS — L538 Other specified erythematous conditions: Secondary | ICD-10-CM | POA: Diagnosis not present

## 2017-06-22 DIAGNOSIS — L300 Nummular dermatitis: Secondary | ICD-10-CM | POA: Diagnosis not present

## 2017-06-22 DIAGNOSIS — L299 Pruritus, unspecified: Secondary | ICD-10-CM | POA: Diagnosis not present

## 2017-06-22 DIAGNOSIS — L719 Rosacea, unspecified: Secondary | ICD-10-CM | POA: Diagnosis not present

## 2017-07-29 DIAGNOSIS — C44319 Basal cell carcinoma of skin of other parts of face: Secondary | ICD-10-CM | POA: Diagnosis not present

## 2017-07-29 DIAGNOSIS — L821 Other seborrheic keratosis: Secondary | ICD-10-CM | POA: Diagnosis not present

## 2017-07-29 DIAGNOSIS — D1801 Hemangioma of skin and subcutaneous tissue: Secondary | ICD-10-CM | POA: Diagnosis not present

## 2017-07-29 DIAGNOSIS — L820 Inflamed seborrheic keratosis: Secondary | ICD-10-CM | POA: Diagnosis not present

## 2017-08-06 DIAGNOSIS — C44319 Basal cell carcinoma of skin of other parts of face: Secondary | ICD-10-CM | POA: Diagnosis not present

## 2017-08-27 DIAGNOSIS — L820 Inflamed seborrheic keratosis: Secondary | ICD-10-CM | POA: Diagnosis not present

## 2017-08-27 DIAGNOSIS — D2239 Melanocytic nevi of other parts of face: Secondary | ICD-10-CM | POA: Diagnosis not present

## 2017-08-27 DIAGNOSIS — L821 Other seborrheic keratosis: Secondary | ICD-10-CM | POA: Diagnosis not present

## 2017-08-27 DIAGNOSIS — D485 Neoplasm of uncertain behavior of skin: Secondary | ICD-10-CM | POA: Diagnosis not present

## 2017-08-27 DIAGNOSIS — D225 Melanocytic nevi of trunk: Secondary | ICD-10-CM | POA: Diagnosis not present

## 2017-08-27 DIAGNOSIS — C44319 Basal cell carcinoma of skin of other parts of face: Secondary | ICD-10-CM | POA: Diagnosis not present

## 2017-08-27 DIAGNOSIS — L309 Dermatitis, unspecified: Secondary | ICD-10-CM | POA: Diagnosis not present

## 2017-08-27 DIAGNOSIS — C44719 Basal cell carcinoma of skin of left lower limb, including hip: Secondary | ICD-10-CM | POA: Diagnosis not present

## 2017-09-05 ENCOUNTER — Encounter: Payer: Self-pay | Admitting: Cardiology

## 2017-09-05 ENCOUNTER — Ambulatory Visit (INDEPENDENT_AMBULATORY_CARE_PROVIDER_SITE_OTHER): Payer: Medicare Other | Admitting: Cardiology

## 2017-09-05 VITALS — BP 110/64 | HR 99 | Ht 71.0 in | Wt 254.0 lb

## 2017-09-05 DIAGNOSIS — Z8709 Personal history of other diseases of the respiratory system: Secondary | ICD-10-CM | POA: Diagnosis not present

## 2017-09-05 DIAGNOSIS — E785 Hyperlipidemia, unspecified: Secondary | ICD-10-CM

## 2017-09-05 DIAGNOSIS — Z7901 Long term (current) use of anticoagulants: Secondary | ICD-10-CM | POA: Diagnosis not present

## 2017-09-05 DIAGNOSIS — I25118 Atherosclerotic heart disease of native coronary artery with other forms of angina pectoris: Secondary | ICD-10-CM

## 2017-09-05 DIAGNOSIS — I5032 Chronic diastolic (congestive) heart failure: Secondary | ICD-10-CM

## 2017-09-05 DIAGNOSIS — R0602 Shortness of breath: Secondary | ICD-10-CM | POA: Diagnosis not present

## 2017-09-05 DIAGNOSIS — Z79899 Other long term (current) drug therapy: Secondary | ICD-10-CM | POA: Diagnosis not present

## 2017-09-05 DIAGNOSIS — N183 Chronic kidney disease, stage 3 unspecified: Secondary | ICD-10-CM

## 2017-09-05 DIAGNOSIS — I480 Paroxysmal atrial fibrillation: Secondary | ICD-10-CM

## 2017-09-05 MED ORDER — FUROSEMIDE 20 MG PO TABS: 60.0000 mg | ORAL_TABLET | Freq: Every day | ORAL | Status: DC

## 2017-09-05 MED ORDER — METOPROLOL TARTRATE 25 MG PO TABS: 25.0000 mg | ORAL_TABLET | Freq: Two times a day (BID) | ORAL | 3 refills | Status: DC

## 2017-09-05 NOTE — Patient Instructions (Addendum)
Medication Instructions:  Your physician has recommended you make the following change in your medication:  INCREASE metoprolol to 25 mg twice daily INCREASE lasix to 60 mg twice daily  Labwork: None  Testing/Procedures: A chest x-ray takes a picture of the organs and structures inside the chest, including the heart, lungs, and blood vessels. This test can show several things, including, whether the heart is enlarges; whether fluid is building up in the lungs; and whether pacemaker / defibrillator leads are still in place.  Please have this done at [facility] outpatient center today.  Follow-Up: Your physician recommends that you schedule a follow-up appointment in: 1 week  Any Other Special Instructions Will Be Listed Below (If Applicable).     If you need a refill on your cardiac medications before your next appointment, please call your pharmacy.   [place], RN, BSN  Heart Failure  Weigh yourself every morning when you first wake up and record on a calender or note pad, bring this to your office visits. Using a pill tender can help with taking your medications consistently.  Limit your fluid intake to 2 liters daily  Limit your sodium intake to less than 2-3 grams daily. Ask if you need dietary teaching.  If you gain more than 3 pounds (from your dry weight ), double your dose of diuretic for the day.  If you gain more than 5 pounds (from your dry weight), double your dose of lasix and call your heart failure doctor.  Please do not smoke tobacco since it is very bad for your heart.  Please do not drink alcohol since it can worsen your heart failure.Also avoid OTC nonsteroidal drugs, such as advil, aleve and motrin.  Try to exercise for at least 30 minutes every day because this will help your heart be more efficient. You may be eligible for supervised cardiac rehab, ask your physician.

## 2017-09-05 NOTE — Progress Notes (Signed)
Cardiology Office Note:    Date:  09/05/2017   ID:  Nathaniel Leach, DOB 1940-03-18, MRN [id number]  PCP:  Street, Sharon Mt, MD  Cardiologist:  Shirlee More, MD    Referring MD: [address], Sharon Mt, *    ASSESSMENT:    1. Chronic diastolic heart failure ([place])   2. PAF (paroxysmal atrial fibrillation) ([place])   3. On amiodarone therapy   4. CKD (chronic kidney disease) stage 3, GFR 30-59 ml/min (HCC)   5. Chronic anticoagulation   6. Hyperlipidemia, unspecified hyperlipidemia type   7. Coronary artery disease of native artery of native heart with stable angina pectoris ([place])    PLAN:    In order of problems listed above:  1. His heart failure is decompensated New York Heart Association class III he has marked fluid overload will double his diuretics on Monday check renal function proBNP follow-up in 1 week.  If unresponsive will require admission for IV diuretic 2. Stable remains in sinus rhythm I returned his beta-blocker to previous dose 3. Stable continue low-dose amiodarone 4. Recheck renal function 1 5. Continue his anticoagulant 6. Stable continue statin 7. Stable continue medical treatment I do not think he requires an ischemia evaluation at this time   Next appointment: 1 week   Medication Adjustments/Labs and Tests Ordered: Current medicines are reviewed at length with the patient today.  Concerns regarding medicines are outlined above.  Orders Placed This Encounter  Procedures  . DG Chest 2 View  . EKG 12-Lead   Meds ordered this encounter  Medications  . furosemide (LASIX) 20 MG tablet    Sig: Take 3 tablets (60 mg total) by mouth daily.  . metoprolol tartrate (LOPRESSOR) 25 MG tablet    Sig: Take 1 tablet (25 mg total) by mouth 2 (two) times daily.    Dispense:  60 tablet    Refill:  3    Chief Complaint  Patient presents with  . Shortness of Breath  . Irregular Heart Beat  . Weight Gain  . Other    blood pressure fluctuating    History  of Present Illness:    ` Nathaniel Leach is a 77 y.o. male with a hx of  Paroxysmal atrial fibrillation on amiodarone CAD EF 55-60% CABG 2004  heart failure hypertension and dyslipidemia   Paroxysmal atrial fibrillation on amiodarone CAD EF 55-60% CABG 2004  heart failure hypertension CKD and dyslipidemia   last seen 05/02/17 . Compliance with diet, lifestyle and medications: Yes  And doing well recently until he traveled gained 10 to 12 pounds noticed increasing peripheral edema and exertional shortness of breath 2 pillow orthopnea and palpitation.  His blood pressure was low 1 day [facility] and they reduced his dose of beta-blocker 50% and his ARB.  He did not increase his diuretic.  No chest pain or syncope.  Past Medical History:  Diagnosis Date  . Arrhythmia   . CHF (congestive heart failure) ([place])   . Chronic kidney disease   . Hyperlipidemia   . Hypertension     Past Surgical History:  Procedure Laterality Date  . APPENDECTOMY    . CATARACT EXTRACTION    . CHOLECYSTECTOMY    . CORONARY ARTERY BYPASS GRAFT  2004  . MOUTH SURGERY    . RHINOPLASTY      Current Medications: Current Meds  Medication Sig  . apixaban (ELIQUIS) 5 MG TABS tablet Take 5 mg by mouth 2 (two) times daily.  Marland Kitchen atorvastatin (  LIPITOR) 40 MG tablet Take 2 tablets (80 mg total) by mouth daily. Takes 1/2 tablet at bedtime  . citalopram (CELEXA) 20 MG tablet Take 10 mg by mouth daily.  . DOCOSAHEXAENOIC ACID PO Take 1,200 mg by mouth daily.  . furosemide (LASIX) 20 MG tablet Take 3 tablets (60 mg total) by mouth daily.  Marland Kitchen glipiZIDE (GLUCOTROL) 5 MG tablet Take 5 mg by mouth 2 (two) times daily before a meal.  . Multiple Vitamin (MULTIVITAMIN) capsule Take by mouth.  . multivitamin-lutein (OCUVITE-LUTEIN) CAPS capsule Take 1 capsule by mouth daily.  Marland Kitchen omeprazole (PRILOSEC) 20 MG capsule Take 20 mg by mouth daily.  . vitamin E 400 UNIT capsule Take 400 Units by mouth daily.  .  [DISCONTINUED] furosemide (LASIX) 40 MG tablet Take 20 mg by mouth 3 (three) times daily.   . [DISCONTINUED] losartan (COZAAR) 25 MG tablet Take 12.5 mg by mouth daily.  . [DISCONTINUED] metoprolol tartrate (LOPRESSOR) 12.5 mg TABS tablet Take 12.5 mg by mouth 2 (two) times daily.     Allergies:   Sertraline   Social History   Socioeconomic History  . Marital status: Married    Spouse name: Not on file  . Number of children: Not on file  . Years of education: Not on file  . Highest education level: Not on file  Occupational History  . Not on file  Social Needs  . Financial resource strain: Not on file  . Food insecurity:    Worry: Not on file    Inability: Not on file  . Transportation needs:    Medical: Not on file    Non-medical: Not on file  Tobacco Use  . Smoking status: Former Research scientist (life sciences)  . Smokeless tobacco: Former Systems developer    Types: Chew  Substance and Sexual Activity  . Alcohol use: Not on file  . Drug use: Not on file  . Sexual activity: Not on file  Lifestyle  . Physical activity:    Days per week: Not on file    Minutes per session: Not on file  . Stress: Not on file  Relationships  . Social connections:    Talks on phone: Not on file    Gets together: Not on file    Attends religious service: Not on file    Active member of club or organization: Not on file    Attends meetings of clubs or organizations: Not on file    Relationship status: Not on file  Other Topics Concern  . Not on file  Social History Narrative  . Not on file     Family History: The patient's family history includes CAD in his father; Diabetes in his brother; Stroke in his mother. ROS:   Please see the history of present illness.    All other systems reviewed and are negative.  EKGs/Labs/Other Studies Reviewed:    The following studies were reviewed today:  EKG:  EKG ordered today.  The ekg ordered today demonstrates   Recent Labs: 10/08/2016: ALT 49; TSH 0.881 04/04/2017: BUN 17;  Creatinine, Ser 1.66; NT-Pro BNP 148; Potassium 4.9; Sodium 141  Recent Lipid Panel    Component Value Date/Time   CHOL 128 10/08/2016 1110   TRIG 258 (H) 10/08/2016 1110   HDL 36 (L) 10/08/2016 1110   CHOLHDL 3.6 10/08/2016 1110   LDLCALC 40 10/08/2016 1110    Physical Exam:    VS:  BP 110/64 (BP Location: Left Arm, Patient Position: Sitting)   Pulse 99  Ht 5\' 11"  (1.803 m)   Wt 254 lb (115.2 kg)   SpO2 97%   BMI 35.43 kg/m     Wt Readings from Last 3 Encounters:  09/05/17 254 lb (115.2 kg)  05/02/17 250 lb 12.8 oz (113.8 kg)  11/23/16 248 lb 12.8 oz (112.9 kg)     GEN:  Well nourished, well developed in no acute distress HEENT: Normal NECK: No JVD; No carotid bruits LYMPHATICS: No lymphadenopathy CARDIAC: RRR, no murmurs, rubs, gallops RESPIRATORY:  At the bases few  rales, wheezing or rhonchi  ABDOMEN: Soft, non-tender, non-distended MUSCULOSKELETAL:  2-3+ edemal to the knees bilateral edema; No deformity  SKIN: Warm and dry NEUROLOGIC:  Alert and oriented x 3 PSYCHIATRIC:  Normal affect    Signed, Shirlee More, MD  09/05/2017 5:06 PM    [facility]

## 2017-09-12 ENCOUNTER — Encounter: Payer: Self-pay | Admitting: Cardiology

## 2017-09-12 ENCOUNTER — Other Ambulatory Visit: Payer: Self-pay | Admitting: Cardiology

## 2017-09-12 ENCOUNTER — Ambulatory Visit (INDEPENDENT_AMBULATORY_CARE_PROVIDER_SITE_OTHER): Payer: Medicare Other | Admitting: Cardiology

## 2017-09-12 VITALS — BP 134/80 | HR 94 | Ht 71.0 in | Wt 250.8 lb

## 2017-09-12 DIAGNOSIS — N183 Chronic kidney disease, stage 3 unspecified: Secondary | ICD-10-CM

## 2017-09-12 DIAGNOSIS — I130 Hypertensive heart and chronic kidney disease with heart failure and stage 1 through stage 4 chronic kidney disease, or unspecified chronic kidney disease: Secondary | ICD-10-CM

## 2017-09-12 DIAGNOSIS — I25118 Atherosclerotic heart disease of native coronary artery with other forms of angina pectoris: Secondary | ICD-10-CM

## 2017-09-12 DIAGNOSIS — I480 Paroxysmal atrial fibrillation: Secondary | ICD-10-CM | POA: Diagnosis not present

## 2017-09-12 DIAGNOSIS — I5032 Chronic diastolic (congestive) heart failure: Secondary | ICD-10-CM | POA: Diagnosis not present

## 2017-09-12 LAB — BASIC METABOLIC PANEL
BUN/Creatinine Ratio: 12 (ref 10–24)
BUN: 21 mg/dL (ref 8–27)
CALCIUM: 9.7 mg/dL (ref 8.6–10.2)
CHLORIDE: 92 mmol/L — AB (ref 96–106)
CO2: 26 mmol/L (ref 20–29)
Creatinine, Ser: 1.69 mg/dL — ABNORMAL HIGH (ref 0.76–1.27)
GFR, EST AFRICAN AMERICAN: 44 mL/min/{1.73_m2} — AB (ref >59)
GFR, EST NON AFRICAN AMERICAN: 38 mL/min/{1.73_m2} — AB (ref >59)
Glucose: 319 mg/dL — ABNORMAL HIGH (ref 65–99)
POTASSIUM: 3.7 mmol/L (ref 3.5–5.2)
Sodium: 136 mmol/L (ref 134–144)

## 2017-09-12 LAB — PRO B NATRIURETIC PEPTIDE: NT-Pro BNP: 171 pg/mL (ref 0–486)

## 2017-09-12 MED ORDER — TORSEMIDE 20 MG PO TABS
20.0000 mg | ORAL_TABLET | Freq: Two times a day (BID) | ORAL | 3 refills | Status: DC
Start: 2017-09-12 — End: 2017-12-17

## 2017-09-12 NOTE — Patient Instructions (Addendum)
Medication Instructions:  Your physician has recommended you make the following change in your medication:   STOP: Furosemide START:  Torsemide (Demadex) 20 mg one tablet two times a day   Labwork: Your physician recommends that you have the following labs drawn: BMP and BNP   Testing/Procedures: You had an EKG today  Follow-Up: Your physician wants you to follow-up in: 3 months.  You will receive a reminder letter in the mail two months in advance. If you don't receive a letter, please call our office to schedule the follow-up appointment.   Any Other Special Instructions Will Be Listed Below (If Applicable).     If you need a refill on your cardiac medications before your next appointment, please call your pharmacy.    KNOW YOUR HEART FAILURE ZONES  Green Zone (Your Goal):  Marland Kitchen No shortness of breath or trouble bleeding . No weight gain of more than 3 pounds in one day or 5 pounds in a week . No swelling in your ankles, feet, stomach, or hands . No chest discomfort, heaviness or pain  Yellow Zone (Call the Doctor to get help!): Having 1 or more of the following: . Weight gain of 3 pounds in 1 day or 5 pounds in 1 week . More swelling of your feet, ankles, stomach, or hands . It is harder to breath when lying down. You need to sit up . Chest discomfort, heaviness, or pain . You feel more tired or have less energy than normal . New or worsening dizziness . Dry hacking cough . You feel uneasy and you know something is not right  Red Zone (Call 911-EMERGENCY): . Struggling to breathe. This does not go away when you sit up. . Stronger and more regular amounts of chest discomfort . New confusion or can't think clearly . Fainting or near fainting   Resources form the American heart Association: RetroDivas.ch.jsp

## 2017-09-12 NOTE — Progress Notes (Signed)
Cardiology Office Note:    Date:  09/12/2017   ID:  Nathaniel Leach, DOB 11/02/1940, MRN [id number]  PCP:  Street, [place] Mt, MD  Cardiologist:  Shirlee More, MD    Referring MD: [address], [place] Mt, *    ASSESSMENT:    1. Chronic diastolic heart failure ([place])   2. Hypertensive heart and chronic kidney disease with heart failure and stage 1 through stage 4 chronic kidney disease, or unspecified chronic kidney disease ([place])   3. CKD (chronic kidney disease) stage 3, GFR 30-59 ml/min (HCC)   4. Coronary artery disease of native artery of native heart with stable angina pectoris ([place])   5. PAF (paroxysmal atrial fibrillation) (HCC)    PLAN:    In order of problems listed above:  1. Improved although his weight is only down 4 pounds he has had obviously a greater diuresis as resolved his edema and I will switch him to a more potent predictable loop diuretic torsemide recheck renal function will be proBNP today for safety.  I stressed with him the importance of compliance with sodium restriction and self-management. 2. Stable recheck renal function today blood pressure is controlled on current medications and continue 3. See above   Next appointment: 3 months   Medication Adjustments/Labs and Tests Ordered: Current medicines are reviewed at length with the patient today.  Concerns regarding medicines are outlined above.  Orders Placed This Encounter  Procedures  . Basic Metabolic Panel (BMET)  . Pro b natriuretic peptide (BNP)  . EKG 12-Lead   Meds ordered this encounter  Medications  . torsemide (DEMADEX) 20 MG tablet    Sig: Take 1 tablet (20 mg total) by mouth 2 (two) times daily.    Dispense:  60 tablet    Refill:  3    Chief Complaint  Patient presents with  . Follow-up    recently decompensated HF  . Congestive Heart Failure    History of Present Illness:    Nathaniel Leach is a 77 y.o. male with a hx of Paroxysmal atrial fibrillation on amiodarone CAD  EF 55-60% CABG 2004  heart failure hypertension and dyslipidemia  last seen 09/05/17 with decompensated heart failure. Compliance with diet, lifestyle and medications: Yes Although weight is only down 4 pounds he feels markedly improved orthopnea shortness of breath and edema have resolved.  He had an episode of rapid heart rhythm over the weekend that lasted 1 hour.  EKG confirms he is in sinus rhythm.  He has had no chest pain or syncope Past Medical History:  Diagnosis Date  . Arrhythmia   . CHF (congestive heart failure) ([place])   . Chronic kidney disease   . Hyperlipidemia   . Hypertension     Past Surgical History:  Procedure Laterality Date  . APPENDECTOMY    . CATARACT EXTRACTION    . CHOLECYSTECTOMY    . CORONARY ARTERY BYPASS GRAFT  2004  . MOUTH SURGERY    . RHINOPLASTY      Current Medications: Current Meds  Medication Sig  . apixaban (ELIQUIS) 5 MG TABS tablet Take 5 mg by mouth 2 (two) times daily.  Marland Kitchen atorvastatin (LIPITOR) 80 MG tablet Take 40 mg by mouth daily.  . citalopram (CELEXA) 20 MG tablet Take 10 mg by mouth daily.  . DOCOSAHEXAENOIC ACID PO Take 1,200 mg by mouth daily.  Marland Kitchen glipiZIDE (GLUCOTROL) 5 MG tablet Take 5 mg by mouth 2 (two) times daily before a meal.  . metoprolol tartrate (LOPRESSOR) 25  MG tablet Take 1 tablet (25 mg total) by mouth 2 (two) times daily.  . Multiple Vitamin (MULTIVITAMIN) capsule Take by mouth.  . multivitamin-lutein (OCUVITE-LUTEIN) CAPS capsule Take 1 capsule by mouth daily.  Marland Kitchen omeprazole (PRILOSEC) 20 MG capsule Take 20 mg by mouth daily.  . vitamin E 400 UNIT capsule Take 400 Units by mouth daily.  . [DISCONTINUED] furosemide (LASIX) 20 MG tablet Take 120 mg by mouth 2 (two) times daily.     Allergies:   Sertraline   Social History   Socioeconomic History  . Marital status: Married    Spouse name: Not on file  . Number of children: Not on file  . Years of education: Not on file  . Highest education level: Not on file    Occupational History  . Not on file  Social Needs  . Financial resource strain: Not on file  . Food insecurity:    Worry: Not on file    Inability: Not on file  . Transportation needs:    Medical: Not on file    Non-medical: Not on file  Tobacco Use  . Smoking status: Former Research scientist (life sciences)  . Smokeless tobacco: Former Systems developer    Types: Chew  Substance and Sexual Activity  . Alcohol use: Not Currently  . Drug use: Never  . Sexual activity: Not on file  Lifestyle  . Physical activity:    Days per week: Not on file    Minutes per session: Not on file  . Stress: Not on file  Relationships  . Social connections:    Talks on phone: Not on file    Gets together: Not on file    Attends religious service: Not on file    Active member of club or organization: Not on file    Attends meetings of clubs or organizations: Not on file    Relationship status: Not on file  Other Topics Concern  . Not on file  Social History Narrative  . Not on file     Family History: The patient's family history includes CAD in his father; Diabetes in his brother; Stroke in his mother. ROS:   Please see the history of present illness.    All other systems reviewed and are negative.  EKGs/Labs/Other Studies Reviewed:    The following studies were reviewed today EKG today sinus rhythm Recent Labs: 10/08/2016: ALT 49; TSH 0.881 04/04/2017: BUN 17; Creatinine, Ser 1.66; NT-Pro BNP 148; Potassium 4.9; Sodium 141  Recent Lipid Panel    Component Value Date/Time   CHOL 128 10/08/2016 1110   TRIG 258 (H) 10/08/2016 1110   HDL 36 (L) 10/08/2016 1110   CHOLHDL 3.6 10/08/2016 1110   LDLCALC 40 10/08/2016 1110    Physical Exam:    VS:  BP 134/80 (BP Location: Right Arm, Patient Position: Sitting, Cuff Size: Large)   Pulse 94   Ht 5\' 11"  (1.803 m)   Wt 250 lb 12.8 oz (113.8 kg)   SpO2 98%   BMI 34.98 kg/m     Wt Readings from Last 3 Encounters:  09/12/17 250 lb 12.8 oz (113.8 kg)  09/05/17 254 lb (115.2  kg)  05/02/17 250 lb 12.8 oz (113.8 kg)     GEN:  Well nourished, well developed in no acute distress HEENT: Normal NECK: No JVD; No carotid bruits LYMPHATICS: No lymphadenopathy CARDIAC: RRR, no murmurs, rubs, gallops RESPIRATORY:  Clear to auscultation without rales, wheezing or rhonchi  ABDOMEN: Soft, non-tender, non-distended MUSCULOSKELETAL:  No edema; No  deformity  SKIN: Warm and dry NEUROLOGIC:  Alert and oriented x 3 PSYCHIATRIC:  Normal affect    Signed, Shirlee More, MD  09/12/2017 11:59 AM    [place]

## 2017-09-17 DIAGNOSIS — D0461 Carcinoma in situ of skin of right upper limb, including shoulder: Secondary | ICD-10-CM | POA: Diagnosis not present

## 2017-10-15 DIAGNOSIS — D225 Melanocytic nevi of trunk: Secondary | ICD-10-CM | POA: Diagnosis not present

## 2017-10-15 DIAGNOSIS — L821 Other seborrheic keratosis: Secondary | ICD-10-CM | POA: Diagnosis not present

## 2017-10-15 DIAGNOSIS — D0461 Carcinoma in situ of skin of right upper limb, including shoulder: Secondary | ICD-10-CM | POA: Diagnosis not present

## 2017-12-16 NOTE — Progress Notes (Signed)
Cardiology Office Note:    Date:  12/17/2017   ID:  Nathaniel Leach, DOB [birth year], MRN [id number]  PCP:  Street, Sharon Mt, MD  Cardiologist:  Shirlee More, MD    Referring MD: [address], [place] Mt, *    ASSESSMENT:    1. PAF (paroxysmal atrial fibrillation) ([place])   2. On amiodarone therapy   3. Chronic anticoagulation   4. Chronic diastolic heart failure ([place])   5. Hypertensive heart and chronic kidney disease with heart failure and stage 1 through stage 4 chronic kidney disease, or unspecified chronic kidney disease ([place])   6. Coronary artery disease of native artery of native heart with stable angina pectoris ([place])   7. Dyslipidemia    PLAN:    In order of problems listed above:  1. Stable remains in sinus rhythm continue amiodarone he had a chest x-ray in July no lung toxicity if not done I will order liver profile and TSH and continue his anticoagulant. 2. Stable continue amiodarone remains in sinus rhythm 3. Stable continue his anticoagulant no need for dose adjustment with CKD 4. Unfortunately decompensated and fluid overload as a consequence of decreasing diuretic I told him as long as he is volume overloaded is not an issue take an additional diuretic he will go back to 20 mg twice daily torsemide and when he hits a goal of 240 pounds to take it once per day.  I try to avoid giving him Zaroxolyn as previously his kidney function deteriorated. 5. Stable followed by nephrology continue current treatment including beta-blocker 6. Stable CAD having no anginal discomfort New [place] Heart Association class I continue current treatment including high intensity statin 7. Stable continue his high intensity statin await labs from the [place]   Next appointment: 3 months although he may need to be seen sooner if he does not respond to increasing diuretic   Medication Adjustments/Labs and Tests Ordered: Current medicines are reviewed at length with the patient today.  Concerns  regarding medicines are outlined above.  No orders of the defined types were placed in this encounter.  No orders of the defined types were placed in this encounter.   Chief Complaint  Patient presents with  . Atrial Fibrillation    on amiodarone  . Congestive Heart Failure  . Coronary Artery Disease  . Chronic Kidney Disease  . Hyperlipidemia    History of Present Illness:    Nathaniel Leach is a 77 y.o. male with a hx of Paroxysmal atrial fibrillation on amiodarone CAD EF 55-60% CABG 2004  heart failure hypertension CKD and dyslipidemia  last seen 09/12/17. Compliance with diet, lifestyle and medications: Yes  He is receiving care at the [facility] saw nephrologist he has stable stage III CKD he had seen a cardiologist echocardiogram was performed and is told by the nephrologist that the report was good he is going to drop off a list of his labs that were performed he is also followed for his amiodarone with lipid and thyroid and see if he can download a copy of an echocardiogram for my records.  His weight is up somewhere 15 to 20 pounds in the last 6 months and his diuretic dose was decreased by the nephrologist 50%.  He is having peripheral edema and exertional shortness of breath no orthopnea chest pain palpitation or syncope.  He is anticoagulated with no bleeding complication.  Today's fluid overloaded he has heart failure is decompensated will have him take his diuretic back to twice daily  and reduce if he has a goal weight of 240 pounds to once daily his weight yesterday 254 pounds. Past Medical History:  Diagnosis Date  . Arrhythmia   . CHF (congestive heart failure) ([place])   . Chronic kidney disease   . Hyperlipidemia   . Hypertension     Past Surgical History:  Procedure Laterality Date  . APPENDECTOMY    . CATARACT EXTRACTION    . CHOLECYSTECTOMY    . CORONARY ARTERY BYPASS GRAFT  2004  . MOUTH SURGERY    . RHINOPLASTY      Current  Medications: Current Meds  Medication Sig  . apixaban (ELIQUIS) 5 MG TABS tablet Take 5 mg by mouth 2 (two) times daily.  Marland Kitchen atorvastatin (LIPITOR) 80 MG tablet Take 80 mg by mouth daily.   . citalopram (CELEXA) 20 MG tablet Take 10 mg by mouth daily.  . DOCOSAHEXAENOIC ACID PO Take 1,200 mg by mouth daily.  . famotidine (PEPCID) 20 MG tablet Take 20 mg by mouth daily.  Marland Kitchen glipiZIDE (GLUCOTROL) 5 MG tablet Take 5 mg by mouth 2 (two) times daily before a meal.  . metoprolol tartrate (LOPRESSOR) 25 MG tablet Take 1 tablet (25 mg total) by mouth 2 (two) times daily.  . Multiple Vitamin (MULTIVITAMIN) capsule Take by mouth.  . multivitamin-lutein (OCUVITE-LUTEIN) CAPS capsule Take 1 capsule by mouth daily.  Marland Kitchen torsemide (DEMADEX) 20 MG tablet Take 20 mg by mouth daily.  . vitamin E 400 UNIT capsule Take 400 Units by mouth daily.     Allergies:   Sertraline   Social History   Socioeconomic History  . Marital status: Married    Spouse name: Not on file  . Number of children: Not on file  . Years of education: Not on file  . Highest education level: Not on file  Occupational History  . Not on file  Social Needs  . Financial resource strain: Not on file  . Food insecurity:    Worry: Not on file    Inability: Not on file  . Transportation needs:    Medical: Not on file    Non-medical: Not on file  Tobacco Use  . Smoking status: Former Research scientist (life sciences)  . Smokeless tobacco: Former Systems developer    Types: Chew  Substance and Sexual Activity  . Alcohol use: Not Currently  . Drug use: Never  . Sexual activity: Not on file  Lifestyle  . Physical activity:    Days per week: Not on file    Minutes per session: Not on file  . Stress: Not on file  Relationships  . Social connections:    Talks on phone: Not on file    Gets together: Not on file    Attends religious service: Not on file    Active member of club or organization: Not on file    Attends meetings of clubs or organizations: Not on file     Relationship status: Not on file  Other Topics Concern  . Not on file  Social History Narrative  . Not on file     Family History: The patient's family history includes CAD in his father; Diabetes in his brother; Stroke in his mother. ROS:   Please see the history of present illness.    All other systems reviewed and are negative.  EKGs/Labs/Other Studies Reviewed:    The following studies were reviewed today:   Recent Labs: 09/12/2017: BUN 21; Creatinine, Ser 1.69; NT-Pro BNP 171; Potassium 3.7; Sodium 136  Recent  Lipid Panel    Component Value Date/Time   CHOL 128 10/08/2016 1110   TRIG 258 (H) 10/08/2016 1110   HDL 36 (L) 10/08/2016 1110   CHOLHDL 3.6 10/08/2016 1110   LDLCALC 40 10/08/2016 1110    Physical Exam:    VS:  BP 118/72 (BP Location: Right Arm, Patient Position: Sitting, Cuff Size: Large)   Pulse 82   Ht 5\' 11"  (1.803 m)   Wt 261 lb (118.4 kg)   SpO2 96%   BMI 36.40 kg/m     Wt Readings from Last 3 Encounters:  12/17/17 261 lb (118.4 kg)  09/12/17 250 lb 12.8 oz (113.8 kg)  09/05/17 254 lb (115.2 kg)     GEN:  Well nourished, well developed in no acute distress HEENT: Normal NECK: No JVD; No carotid bruits LYMPHATICS: No lymphadenopathy CARDIAC: Soft S1 regular rhythm EKG in July sinus rhythm RRR, no murmurs, rubs, gallops RESPIRATORY:  Clear to auscultation without rales, wheezing or rhonchi  ABDOMEN: Soft, non-tender, non-distended MUSCULOSKELETAL: 3+ edema bilateral to the knees edema; No deformity  SKIN: Warm and dry NEUROLOGIC:  Alert and oriented x 3 PSYCHIATRIC:  Normal affect    Signed, Shirlee More, MD  12/17/2017 11:31 AM    [place]

## 2017-12-17 ENCOUNTER — Ambulatory Visit (INDEPENDENT_AMBULATORY_CARE_PROVIDER_SITE_OTHER): Payer: Medicare Other | Admitting: Cardiology

## 2017-12-17 VITALS — BP 118/72 | HR 82 | Ht 71.0 in | Wt 261.0 lb

## 2017-12-17 DIAGNOSIS — I480 Paroxysmal atrial fibrillation: Secondary | ICD-10-CM | POA: Diagnosis not present

## 2017-12-17 DIAGNOSIS — I5032 Chronic diastolic (congestive) heart failure: Secondary | ICD-10-CM

## 2017-12-17 DIAGNOSIS — E785 Hyperlipidemia, unspecified: Secondary | ICD-10-CM | POA: Diagnosis not present

## 2017-12-17 DIAGNOSIS — I25118 Atherosclerotic heart disease of native coronary artery with other forms of angina pectoris: Secondary | ICD-10-CM | POA: Diagnosis not present

## 2017-12-17 DIAGNOSIS — I130 Hypertensive heart and chronic kidney disease with heart failure and stage 1 through stage 4 chronic kidney disease, or unspecified chronic kidney disease: Secondary | ICD-10-CM | POA: Diagnosis not present

## 2017-12-17 DIAGNOSIS — Z79899 Other long term (current) drug therapy: Secondary | ICD-10-CM

## 2017-12-17 DIAGNOSIS — Z7901 Long term (current) use of anticoagulants: Secondary | ICD-10-CM

## 2017-12-17 NOTE — Patient Instructions (Signed)
Medication Instructions:  Your physician has recommended you make the following change in your medication:   CHANGE torsemide (demadex) 20 mg: Take 1 tablet twice daily. Take 1 tablet daily if weight is 240 pounds or less.   If you need a refill on your cardiac medications before your next appointment, please call your pharmacy.   Lab work: None  Testing/Procedures: None  Follow-Up: At [facility], you and your health needs are our priority.  As part of our continuing mission to provide you with exceptional heart care, we have created designated Provider Care Teams.  These Care Teams include your primary Cardiologist (physician) and Advanced Practice Providers (APPs -  Physician Assistants and Nurse Practitioners) who all work together to provide you with the care you need, when you need it. You will need a follow up appointment in 3 months.  Please call our office 2 months in advance to schedule this appointment.

## 2017-12-23 ENCOUNTER — Telehealth: Payer: Self-pay | Admitting: Cardiology

## 2017-12-23 ENCOUNTER — Encounter: Payer: Self-pay | Admitting: Cardiology

## 2017-12-23 DIAGNOSIS — C44719 Basal cell carcinoma of skin of left lower limb, including hip: Secondary | ICD-10-CM | POA: Diagnosis not present

## 2017-12-23 DIAGNOSIS — L820 Inflamed seborrheic keratosis: Secondary | ICD-10-CM | POA: Diagnosis not present

## 2017-12-23 MED ORDER — TORSEMIDE 20 MG PO TABS: 20.0000 mg | ORAL_TABLET | Freq: Two times a day (BID) | ORAL | Status: DC

## 2017-12-23 NOTE — Telephone Encounter (Signed)
Increase his torsemide by 50%

## 2017-12-23 NOTE — Telephone Encounter (Signed)
During office visit on Tuesday, 12/17/17, patient's torsemide was increased to 20 mg twice daily. Patient has gained 3.5 pounds since his appointment. Patient states he does have mild shortness of breath but it is no different/worse than it has been for years. He has not checked his blood pressure and has no further complaints. Will have Dr. Bettina Gavia advise.

## 2017-12-23 NOTE — Telephone Encounter (Signed)
Patient has stopped by the office today and stats that he has gained 3 pds since starting meds and Dr Bettina Gavia stated that he would lose immediately, must not be working. Please call patient.

## 2017-12-24 ENCOUNTER — Other Ambulatory Visit: Payer: Self-pay

## 2017-12-24 MED ORDER — TORSEMIDE 20 MG PO TABS: 30.0000 mg | ORAL_TABLET | Freq: Two times a day (BID) | ORAL | 3 refills | Status: DC

## 2017-12-24 NOTE — Telephone Encounter (Signed)
Patient has been educated regarding the med change. Patient will call with BP and weight log should this change not improve his edema.

## 2018-01-15 ENCOUNTER — Telehealth: Payer: Self-pay | Admitting: Cardiology

## 2018-01-15 NOTE — Telephone Encounter (Signed)
Patient was taking Torsemide 30 twice daily, andnow the Nephrologist at the [place] changed it to 20/20 and he has gainged almost 9pds in 4 days, [place] and wants to know if he can go back on 30/30. Please call him.

## 2018-01-15 NOTE — Telephone Encounter (Signed)
Attempted to contact patient on home phone with no answer. Left message to return call on cell phone.

## 2018-01-16 NOTE — Telephone Encounter (Signed)
Patient advised to increase torsemide to original dose of 1.5 tablets (30 mg) twice daily per Dr. Bettina Gavia. Patient verbalized understanding. No further questions.

## 2018-01-16 NOTE — Telephone Encounter (Signed)
[phone], patient returned call, please call back at this number.

## 2018-03-18 NOTE — Progress Notes (Signed)
Cardiology Office Note:    Date:  03/19/2018   ID:  Nathaniel Leach, DOB June 06, 1940, MRN [id number]  PCP:  Street, Sharon Mt, MD  Cardiologist:  Shirlee More, MD    Referring MD: [address] [place], *    ASSESSMENT:    1. Chronic diastolic heart failure ([place])   2. Hypertensive heart and chronic kidney disease with heart failure and stage 1 through stage 4 chronic kidney disease, or unspecified chronic kidney disease ([place])   3. PAF (paroxysmal atrial fibrillation) ([place])   4. On amiodarone therapy   5. Chronic anticoagulation    PLAN:    In order of problems listed above:  1. His weight is up a few pounds will regulate to his program the dose adjust his diuretic and avoid metolazone that was marked decrease in GFR in the past.  Reinforced with him sodium restriction and daily weights to manage heart failure as an outpatient 2. Stable BP at target continue current antihypertensives with a beta-blocker and adjust diuretic with weight gain 3. Stable in sinus rhythm continue low-dose amiodarone and anticoagulant.  Labs are followed at the [facility] 4. Continue amiodarone 5. Continue his anticoagulant   Next appointment: 6 months   Medication Adjustments/Labs and Tests Ordered: Current medicines are reviewed at length with the patient today.  Concerns regarding medicines are outlined above.  No orders of the defined types were placed in this encounter.  No orders of the defined types were placed in this encounter.   No chief complaint on file.   History of Present Illness:     Nathaniel Leach is a 78 y.o. male with a hx of Paroxysmal atrial fibrillation on amiodarone CAD EF 55-60% CABG 2004  heart failure hypertension CKD and dyslipidemia last seen 12/17/17 with decompensated heart failure. Compliance with diet, lifestyle and medications: Yes  Overall is done well but at times his weight trips up above 254 pounds he is more short of breath and will dose adjust his  diuretic.  Shortness breath is intermittent and mild and related to weight no orthopnea edema chest pain palpitation or syncope he remains anticoagulated and is followed at the [facility] hospital recent labs reviewed from October with stage IV CKD. Past Medical History:  Diagnosis Date  . Arrhythmia   . CHF (congestive heart failure) ([place])   . Chronic kidney disease   . Hyperlipidemia   . Hypertension     Past Surgical History:  Procedure Laterality Date  . APPENDECTOMY    . CATARACT EXTRACTION    . CHOLECYSTECTOMY    . CORONARY ARTERY BYPASS GRAFT  2004  . MOUTH SURGERY    . RHINOPLASTY      Current Medications: Current Meds  Medication Sig  . Alogliptin Benzoate 25 MG TABS Take 12.5 mg by mouth daily.  Marland Kitchen apixaban (ELIQUIS) 5 MG TABS tablet Take 5 mg by mouth 2 (two) times daily.  Marland Kitchen atorvastatin (LIPITOR) 80 MG tablet Take 80 mg by mouth daily.   . citalopram (CELEXA) 20 MG tablet Take 10 mg by mouth daily.  . DOCOSAHEXAENOIC ACID PO Take 1,000 mg by mouth daily.   Marland Kitchen glipiZIDE (GLUCOTROL) 10 MG tablet Take 10 mg by mouth 2 (two) times daily before a meal.   . metoprolol tartrate (LOPRESSOR) 50 MG tablet Take 25 mg by mouth 2 (two) times daily.  . Multiple Vitamin (MULTIVITAMIN) capsule Take 1 capsule by mouth daily.   Marland Kitchen omeprazole (PRILOSEC) 20 MG capsule Take 20 mg by mouth daily.  Marland Kitchen  torsemide (DEMADEX) 20 MG tablet Take 1.5 tablets (30 mg total) by mouth 2 (two) times daily.  . vitamin E 400 UNIT capsule Take 400 Units by mouth daily.     Allergies:   Sertraline   Social History   Socioeconomic History  . Marital status: Married    Spouse name: Not on file  . Number of children: Not on file  . Years of education: Not on file  . Highest education level: Not on file  Occupational History  . Not on file  Social Needs  . Financial resource strain: Not on file  . Food insecurity:    Worry: Not on file    Inability: Not on file  . Transportation needs:    Medical: Not on  file    Non-medical: Not on file  Tobacco Use  . Smoking status: Former Research scientist (life sciences)  . Smokeless tobacco: Former Systems developer    Types: Chew  Substance and Sexual Activity  . Alcohol use: Not Currently  . Drug use: Never  . Sexual activity: Not on file  Lifestyle  . Physical activity:    Days per week: Not on file    Minutes per session: Not on file  . Stress: Not on file  Relationships  . Social connections:    Talks on phone: Not on file    Gets together: Not on file    Attends religious service: Not on file    Active member of club or organization: Not on file    Attends meetings of clubs or organizations: Not on file    Relationship status: Not on file  Other Topics Concern  . Not on file  Social History Narrative  . Not on file     Family History: The patient's family history includes CAD in his father; Diabetes in his brother; Stroke in his mother. ROS:   Please see the history of present illness.    All other systems reviewed and are negative.  EKGs/Labs/Other Studies Reviewed:    The following studies were reviewed today:   Recent Labs: 09/12/2017: BUN 21; Creatinine, Ser 1.69; NT-Pro BNP 171; Potassium 3.7; Sodium 136  Recent Lipid Panel    Component Value Date/Time   CHOL 128 10/08/2016 1110   TRIG 258 (H) 10/08/2016 1110   HDL 36 (L) 10/08/2016 1110   CHOLHDL 3.6 10/08/2016 1110   LDLCALC 40 10/08/2016 1110    Physical Exam:    VS:  BP 114/72 (BP Location: Right Arm, Patient Position: Sitting, Cuff Size: Large)   Pulse 92   Ht 5\' 11"  (1.803 m)   Wt 261 lb 3.2 oz (118.5 kg)   SpO2 96%   BMI 36.43 kg/m     Wt Readings from Last 3 Encounters:  03/19/18 261 lb 3.2 oz (118.5 kg)  12/17/17 261 lb (118.4 kg)  09/12/17 250 lb 12.8 oz (113.8 kg)    Vitals from encounters over the past 365 days    03/19/18 12/17/17 09/12/17  Vitals     BP 114/72 118/72 134/80  Pulse Rate 92 82 94  SpO2 96 % 96 % 98 %  Height 5\' 11"  (1.803 m) 5\' 11"  (1.803 m) 5\' 11"  (1.803 m)    Weight 261 lb 3.2 oz (118.5 kg) 261 lb (118.4 kg) 250 lb 12.8 oz (113.8 kg)    GEN:  Well nourished, well developed in no acute distress HEENT: Normal NECK: No JVD; No carotid bruits LYMPHATICS: No lymphadenopathy CARDIAC: RRR, no murmurs, rubs, gallops RESPIRATORY:  Clear  to auscultation without rales, wheezing or rhonchi  ABDOMEN: Soft, non-tender, non-distended MUSCULOSKELETAL:  No edema; No deformity  SKIN: Warm and dry NEUROLOGIC:  Alert and oriented x 3 PSYCHIATRIC:  Normal affect    Signed, Shirlee More, MD  03/19/2018 11:13 AM    [place]

## 2018-03-19 ENCOUNTER — Ambulatory Visit (INDEPENDENT_AMBULATORY_CARE_PROVIDER_SITE_OTHER): Payer: Medicare Other | Admitting: Cardiology

## 2018-03-19 ENCOUNTER — Encounter: Payer: Self-pay | Admitting: Cardiology

## 2018-03-19 VITALS — BP 114/72 | HR 92 | Ht 71.0 in | Wt 261.2 lb

## 2018-03-19 DIAGNOSIS — Z7901 Long term (current) use of anticoagulants: Secondary | ICD-10-CM | POA: Diagnosis not present

## 2018-03-19 DIAGNOSIS — I130 Hypertensive heart and chronic kidney disease with heart failure and stage 1 through stage 4 chronic kidney disease, or unspecified chronic kidney disease: Secondary | ICD-10-CM | POA: Diagnosis not present

## 2018-03-19 DIAGNOSIS — Z79899 Other long term (current) drug therapy: Secondary | ICD-10-CM | POA: Diagnosis not present

## 2018-03-19 DIAGNOSIS — I5032 Chronic diastolic (congestive) heart failure: Secondary | ICD-10-CM

## 2018-03-19 DIAGNOSIS — I480 Paroxysmal atrial fibrillation: Secondary | ICD-10-CM

## 2018-03-19 MED ORDER — TORSEMIDE 20 MG PO TABS: ORAL_TABLET | ORAL | 0 refills | Status: DC

## 2018-03-19 NOTE — Patient Instructions (Addendum)
Medication Instructions:  Your physician has recommended you make the following change in your medication:  CHANGE torsemide (demadex) 20 mg: Take 1.5 tablets (30 mg) twice daily. If you weight is 254 or greater, take 2 tablets (40 mg) twice daily  If you need a refill on your cardiac medications before your next appointment, please call your pharmacy.   Lab work: None  If you have labs (blood work) drawn today and your tests are completely normal, you will receive your results only by: Marland Kitchen MyChart Message (if you have MyChart) OR . A paper copy in the mail If you have any lab test that is abnormal or we need to change your treatment, we will call you to review the results.  Testing/Procedures: None  Follow-Up: At [facility], you and your health needs are our priority.  As part of our continuing mission to provide you with exceptional heart care, we have created designated Provider Care Teams.  These Care Teams include your primary Cardiologist (physician) and Advanced Practice Providers (APPs -  Physician Assistants and Nurse Practitioners) who all work together to provide you with the care you need, when you need it. You will need a follow up appointment in 6 months.  Please call our office 2 months in advance to schedule this appointment.

## 2018-03-19 NOTE — Addendum Note (Signed)
Addended by: Austin Miles on: 03/19/2018 11:43 AM   Modules accepted: Orders

## 2018-03-19 NOTE — Addendum Note (Signed)
Addended by: Austin Miles on: 03/19/2018 11:37 AM   Modules accepted: Orders

## 2018-03-26 DIAGNOSIS — I5020 Unspecified systolic (congestive) heart failure: Secondary | ICD-10-CM | POA: Diagnosis not present

## 2018-03-26 DIAGNOSIS — J329 Chronic sinusitis, unspecified: Secondary | ICD-10-CM | POA: Diagnosis not present

## 2018-03-26 DIAGNOSIS — I4891 Unspecified atrial fibrillation: Secondary | ICD-10-CM | POA: Diagnosis not present

## 2018-03-26 DIAGNOSIS — J40 Bronchitis, not specified as acute or chronic: Secondary | ICD-10-CM | POA: Diagnosis not present

## 2018-04-06 DIAGNOSIS — R05 Cough: Secondary | ICD-10-CM | POA: Diagnosis not present

## 2018-04-06 DIAGNOSIS — J324 Chronic pansinusitis: Secondary | ICD-10-CM | POA: Diagnosis not present

## 2018-04-10 DIAGNOSIS — L728 Other follicular cysts of the skin and subcutaneous tissue: Secondary | ICD-10-CM | POA: Diagnosis not present

## 2018-04-10 DIAGNOSIS — L820 Inflamed seborrheic keratosis: Secondary | ICD-10-CM | POA: Diagnosis not present

## 2018-04-16 DIAGNOSIS — R509 Fever, unspecified: Secondary | ICD-10-CM | POA: Diagnosis not present

## 2018-04-24 ENCOUNTER — Encounter: Payer: Self-pay | Admitting: Gastroenterology

## 2018-04-30 DIAGNOSIS — D2239 Melanocytic nevi of other parts of face: Secondary | ICD-10-CM | POA: Diagnosis not present

## 2018-04-30 DIAGNOSIS — L853 Xerosis cutis: Secondary | ICD-10-CM | POA: Diagnosis not present

## 2018-04-30 DIAGNOSIS — D225 Melanocytic nevi of trunk: Secondary | ICD-10-CM | POA: Diagnosis not present

## 2018-04-30 DIAGNOSIS — D1801 Hemangioma of skin and subcutaneous tissue: Secondary | ICD-10-CM | POA: Diagnosis not present

## 2018-07-01 DIAGNOSIS — L209 Atopic dermatitis, unspecified: Secondary | ICD-10-CM | POA: Diagnosis not present

## 2018-07-01 DIAGNOSIS — L820 Inflamed seborrheic keratosis: Secondary | ICD-10-CM | POA: Diagnosis not present

## 2018-07-01 DIAGNOSIS — C44319 Basal cell carcinoma of skin of other parts of face: Secondary | ICD-10-CM | POA: Diagnosis not present

## 2018-08-06 DIAGNOSIS — L814 Other melanin hyperpigmentation: Secondary | ICD-10-CM | POA: Diagnosis not present

## 2018-08-06 DIAGNOSIS — L820 Inflamed seborrheic keratosis: Secondary | ICD-10-CM | POA: Diagnosis not present

## 2018-09-29 ENCOUNTER — Other Ambulatory Visit: Payer: Self-pay

## 2018-10-31 DIAGNOSIS — Z23 Encounter for immunization: Secondary | ICD-10-CM | POA: Diagnosis not present

## 2018-11-24 DIAGNOSIS — J40 Bronchitis, not specified as acute or chronic: Secondary | ICD-10-CM | POA: Diagnosis not present

## 2018-11-24 DIAGNOSIS — J329 Chronic sinusitis, unspecified: Secondary | ICD-10-CM | POA: Diagnosis not present

## 2018-11-24 DIAGNOSIS — Z6835 Body mass index (BMI) 35.0-35.9, adult: Secondary | ICD-10-CM | POA: Diagnosis not present

## 2018-12-12 DIAGNOSIS — R0602 Shortness of breath: Secondary | ICD-10-CM | POA: Diagnosis not present

## 2018-12-12 DIAGNOSIS — J329 Chronic sinusitis, unspecified: Secondary | ICD-10-CM | POA: Diagnosis not present

## 2018-12-12 DIAGNOSIS — J40 Bronchitis, not specified as acute or chronic: Secondary | ICD-10-CM | POA: Diagnosis not present

## 2018-12-12 DIAGNOSIS — Z6835 Body mass index (BMI) 35.0-35.9, adult: Secondary | ICD-10-CM | POA: Diagnosis not present

## 2019-01-06 DIAGNOSIS — L219 Seborrheic dermatitis, unspecified: Secondary | ICD-10-CM | POA: Diagnosis not present

## 2019-01-06 DIAGNOSIS — C44319 Basal cell carcinoma of skin of other parts of face: Secondary | ICD-10-CM | POA: Diagnosis not present

## 2019-01-06 DIAGNOSIS — L820 Inflamed seborrheic keratosis: Secondary | ICD-10-CM | POA: Diagnosis not present

## 2019-01-06 DIAGNOSIS — D2239 Melanocytic nevi of other parts of face: Secondary | ICD-10-CM | POA: Diagnosis not present

## 2019-01-07 DIAGNOSIS — I10 Essential (primary) hypertension: Secondary | ICD-10-CM | POA: Diagnosis not present

## 2019-01-07 DIAGNOSIS — J329 Chronic sinusitis, unspecified: Secondary | ICD-10-CM | POA: Diagnosis not present

## 2019-01-07 DIAGNOSIS — E1169 Type 2 diabetes mellitus with other specified complication: Secondary | ICD-10-CM | POA: Diagnosis not present

## 2019-01-07 DIAGNOSIS — E785 Hyperlipidemia, unspecified: Secondary | ICD-10-CM | POA: Diagnosis not present

## 2019-01-13 DIAGNOSIS — H18892 Other specified disorders of cornea, left eye: Secondary | ICD-10-CM | POA: Diagnosis not present

## 2019-02-06 DIAGNOSIS — H26492 Other secondary cataract, left eye: Secondary | ICD-10-CM | POA: Diagnosis not present

## 2019-02-18 DIAGNOSIS — J342 Deviated nasal septum: Secondary | ICD-10-CM

## 2019-02-18 DIAGNOSIS — R430 Anosmia: Secondary | ICD-10-CM

## 2019-02-18 HISTORY — DX: Deviated nasal septum: J34.2

## 2019-02-18 HISTORY — DX: Anosmia: R43.0

## 2019-03-30 DIAGNOSIS — R0981 Nasal congestion: Secondary | ICD-10-CM | POA: Insufficient documentation

## 2019-03-30 DIAGNOSIS — J0101 Acute recurrent maxillary sinusitis: Secondary | ICD-10-CM

## 2019-03-30 HISTORY — DX: Acute recurrent maxillary sinusitis: J01.01

## 2019-03-31 NOTE — Progress Notes (Signed)
Cardiology Office Note:    Date:  04/01/2019   ID:  Nathaniel Leach, DOB 1940/06/24, MRN [id number]  PCP:  Street, Sharon Mt, MD  Cardiologist:  Shirlee More, MD    Referring MD: [address] [place] Mt, *    ASSESSMENT:    1. PAF (paroxysmal atrial fibrillation) ([place])   2. On amiodarone therapy   3. Chronic anticoagulation   4. Hypertensive heart and chronic kidney disease with heart failure and stage 1 through stage 4 chronic kidney disease, or unspecified chronic kidney disease ([place])   5. Coronary artery disease of native artery of native heart with stable angina pectoris (HCC)   6. Stage 3 chronic kidney disease, unspecified whether stage 3a or 3b CKD   7. Dyslipidemia    PLAN:    In order of problems listed above:  1. Stable maintaining sinus rhythm presently not on amiodarone he will continue his anticoagulant beta-blocker and if he has frequent breakthrough episodes will need another antiarrhythmic drug like dofetilide or referral for pulmonary vein isolation. 2. Hypertension stable BP at target continue current treatment beta-blocker diuretic 3. Await labs from [facility] regarding dyslipidemia continue statin and CKD   Next appointment: Next months   Medication Adjustments/Labs and Tests Ordered: Current medicines are reviewed at length with the patient today.  Concerns regarding medicines are outlined above.  No orders of the defined types were placed in this encounter.  No orders of the defined types were placed in this encounter.   No chief complaint on file.   History of Present Illness:    Nathaniel Leach is a 79 y.o. male with a hx of Paroxysmal atrial fibrillation on amiodarone CAD EF 55-60% CABG 2004  heart failure hypertension CKD and dyslipidemia last seen 03/19/2018. Compliance with diet, lifestyle and medications: Yes  Overall is done well he has lost a little bit more than 15 pounds that he attributes to loss of taste and smell which may be  related to his ARB.  He will contact his PCP at the [facility] hospital.  His only had one episode of brief atrial fibrillation I told the future he can take an extra tablet of beta-blocker if he needs for rate control no edema shortness of breath chest pain or syncope.  He continues anticoagulation without bleeding complication.  Labs are followed at the [facility] hospital and he assures me he will bring a copy to my office Past Medical History:  Diagnosis Date  . Arrhythmia   . CHF (congestive heart failure) ([place])   . Chronic kidney disease   . Hyperlipidemia   . Hypertension     Past Surgical History:  Procedure Laterality Date  . APPENDECTOMY    . CATARACT EXTRACTION    . CHOLECYSTECTOMY    . CORONARY ARTERY BYPASS GRAFT  2004  . MOUTH SURGERY    . RHINOPLASTY      Current Medications: Current Meds  Medication Sig  . Alogliptin Benzoate 25 MG TABS Take 12.5 mg by mouth daily.  Marland Kitchen apixaban (ELIQUIS) 5 MG TABS tablet Take 5 mg by mouth 2 (two) times daily.  Marland Kitchen atorvastatin (LIPITOR) 80 MG tablet Take 80 mg by mouth daily.   . citalopram (CELEXA) 20 MG tablet Take 10 mg by mouth daily.  Marland Kitchen glipiZIDE (GLUCOTROL) 10 MG tablet Take 10 mg by mouth 2 (two) times daily before a meal.   . metoprolol tartrate (LOPRESSOR) 50 MG tablet Take 25 mg by mouth 2 (two) times daily.  . Multiple Vitamin (MULTIVITAMIN)  capsule Take 1 capsule by mouth daily.   . Omega-3 Fatty Acids (FISH OIL PO) Take by mouth.  Marland Kitchen omeprazole (PRILOSEC) 20 MG capsule Take 20 mg by mouth daily.  Marland Kitchen torsemide (DEMADEX) 20 MG tablet Take 1.5 tablets (30 mg) twice daily. If you weight is 254 or greater, take 2 tablets (40 mg) twice daily.  . vitamin E 400 UNIT capsule Take 400 Units by mouth daily.     Allergies:   Sertraline   Social History   Socioeconomic History  . Marital status: Married    Spouse name: Not on file  . Number of children: Not on file  . Years of education: Not on file  . Highest education level: Not on file   Occupational History  . Not on file  Tobacco Use  . Smoking status: Former Research scientist (life sciences)  . Smokeless tobacco: Former Systems developer    Types: Chew  Substance and Sexual Activity  . Alcohol use: Not Currently  . Drug use: Never  . Sexual activity: Not on file  Other Topics Concern  . Not on file  Social History Narrative  . Not on file   Social Determinants of Health   Financial Resource Strain:   . Difficulty of Paying Living Expenses: Not on file  Food Insecurity:   . Worried About Charity fundraiser in the Last Year: Not on file  . Ran Out of Food in the Last Year: Not on file  Transportation Needs:   . Lack of Transportation (Medical): Not on file  . Lack of Transportation (Non-Medical): Not on file  Physical Activity:   . Days of Exercise per Week: Not on file  . Minutes of Exercise per Session: Not on file  Stress:   . Feeling of Stress : Not on file  Social Connections:   . Frequency of Communication with Friends and Family: Not on file  . Frequency of Social Gatherings with Friends and Family: Not on file  . Attends Religious Services: Not on file  . Active Member of Clubs or Organizations: Not on file  . Attends Archivist Meetings: Not on file  . Marital Status: Not on file     Family History: The patient's family history includes CAD in his father; Diabetes in his brother; Stroke in his mother. ROS:   Please see the history of present illness.    All other systems reviewed and are negative.  EKGs/Labs/Other Studies Reviewed:    The following studies were reviewed today:  EKG:  EKG ordered today and personally reviewed.  The ekg ordered today demonstrates sinus rhythm normal  Recent Labs: No results found for requested labs within last 8760 hours.  Recent Lipid Panel    Component Value Date/Time   CHOL 128 10/08/2016 1110   TRIG 258 (H) 10/08/2016 1110   HDL 36 (L) 10/08/2016 1110   CHOLHDL 3.6 10/08/2016 1110   LDLCALC 40 10/08/2016 1110     Physical Exam:    VS:  BP (!) 144/82   Pulse 96   Ht 5\' 11"  (1.803 m)   Wt 245 lb (111.1 kg)   SpO2 95%   BMI 34.17 kg/m     Wt Readings from Last 3 Encounters:  04/01/19 245 lb (111.1 kg)  03/19/18 261 lb 3.2 oz (118.5 kg)  12/17/17 261 lb (118.4 kg)     GEN:  Well nourished, well developed in no acute distress HEENT: Normal NECK: No JVD; No carotid bruits LYMPHATICS: No lymphadenopathy CARDIAC: RRR,  no murmurs, rubs, gallops RESPIRATORY:  Clear to auscultation without rales, wheezing or rhonchi  ABDOMEN: Soft, non-tender, non-distended MUSCULOSKELETAL:  No edema; No deformity  SKIN: Warm and dry NEUROLOGIC:  Alert and oriented x 3 PSYCHIATRIC:  Normal affect    Signed, Shirlee More, MD  04/01/2019 2:03 PM    [place]

## 2019-04-01 ENCOUNTER — Ambulatory Visit (INDEPENDENT_AMBULATORY_CARE_PROVIDER_SITE_OTHER): Payer: Medicare Other | Admitting: Cardiology

## 2019-04-01 ENCOUNTER — Encounter: Payer: Self-pay | Admitting: Cardiology

## 2019-04-01 ENCOUNTER — Other Ambulatory Visit: Payer: Self-pay

## 2019-04-01 VITALS — BP 144/82 | HR 96 | Ht 71.0 in | Wt 245.0 lb

## 2019-04-01 DIAGNOSIS — I480 Paroxysmal atrial fibrillation: Secondary | ICD-10-CM | POA: Diagnosis not present

## 2019-04-01 DIAGNOSIS — I130 Hypertensive heart and chronic kidney disease with heart failure and stage 1 through stage 4 chronic kidney disease, or unspecified chronic kidney disease: Secondary | ICD-10-CM | POA: Diagnosis not present

## 2019-04-01 DIAGNOSIS — Z7901 Long term (current) use of anticoagulants: Secondary | ICD-10-CM | POA: Diagnosis not present

## 2019-04-01 DIAGNOSIS — Z79899 Other long term (current) drug therapy: Secondary | ICD-10-CM

## 2019-04-01 DIAGNOSIS — N183 Chronic kidney disease, stage 3 unspecified: Secondary | ICD-10-CM

## 2019-04-01 DIAGNOSIS — E785 Hyperlipidemia, unspecified: Secondary | ICD-10-CM

## 2019-04-01 DIAGNOSIS — I25118 Atherosclerotic heart disease of native coronary artery with other forms of angina pectoris: Secondary | ICD-10-CM

## 2019-04-01 NOTE — Patient Instructions (Signed)
Medication Instructions:  Your physician recommends that you continue on your current medications as directed. Please refer to the Current Medication list given to you today.  *If you need a refill on your cardiac medications before your next appointment, please call your pharmacy*  Lab Work: NONE If you have labs (blood work) drawn today and your tests are completely normal, you will receive your results only by: . MyChart Message (if you have MyChart) OR . A paper copy in the mail If you have any lab test that is abnormal or we need to change your treatment, we will call you to review the results.  Testing/Procedures: You had an EKG performed today  Follow-Up: At [facility], you and your health needs are our priority.  As part of our continuing mission to provide you with exceptional heart care, we have created designated Provider Care Teams.  These Care Teams include your primary Cardiologist (physician) and Advanced Practice Providers (APPs -  Physician Assistants and Nurse Practitioners) who all work together to provide you with the care you need, when you need it.  Your next appointment:   6 month(s)  The format for your next appointment:   In Person  Provider:   Brian Munley, MD    

## 2019-04-28 DIAGNOSIS — J322 Chronic ethmoidal sinusitis: Secondary | ICD-10-CM | POA: Insufficient documentation

## 2019-04-28 DIAGNOSIS — J320 Chronic maxillary sinusitis: Secondary | ICD-10-CM | POA: Insufficient documentation

## 2019-04-28 DIAGNOSIS — R490 Dysphonia: Secondary | ICD-10-CM | POA: Insufficient documentation

## 2019-04-28 HISTORY — DX: Chronic ethmoidal sinusitis: J32.2

## 2019-07-02 ENCOUNTER — Other Ambulatory Visit: Payer: Self-pay

## 2019-07-02 ENCOUNTER — Encounter: Payer: Self-pay | Admitting: Cardiology

## 2019-07-02 ENCOUNTER — Ambulatory Visit (INDEPENDENT_AMBULATORY_CARE_PROVIDER_SITE_OTHER): Payer: Medicare Other | Admitting: Cardiology

## 2019-07-02 ENCOUNTER — Telehealth (HOSPITAL_COMMUNITY): Payer: Self-pay | Admitting: *Deleted

## 2019-07-02 VITALS — BP 120/70 | HR 84 | Ht 71.0 in | Wt 227.0 lb

## 2019-07-02 DIAGNOSIS — I480 Paroxysmal atrial fibrillation: Secondary | ICD-10-CM

## 2019-07-02 DIAGNOSIS — E785 Hyperlipidemia, unspecified: Secondary | ICD-10-CM | POA: Diagnosis not present

## 2019-07-02 DIAGNOSIS — I2510 Atherosclerotic heart disease of native coronary artery without angina pectoris: Secondary | ICD-10-CM

## 2019-07-02 DIAGNOSIS — E782 Mixed hyperlipidemia: Secondary | ICD-10-CM | POA: Diagnosis not present

## 2019-07-02 DIAGNOSIS — Z951 Presence of aortocoronary bypass graft: Secondary | ICD-10-CM

## 2019-07-02 DIAGNOSIS — R0600 Dyspnea, unspecified: Secondary | ICD-10-CM

## 2019-07-02 DIAGNOSIS — N183 Chronic kidney disease, stage 3 unspecified: Secondary | ICD-10-CM

## 2019-07-02 DIAGNOSIS — R0609 Other forms of dyspnea: Secondary | ICD-10-CM

## 2019-07-02 MED ORDER — METOLAZONE 2.5 MG PO TABS
2.5000 mg | ORAL_TABLET | Freq: Every day | ORAL | 3 refills | Status: DC
Start: 2019-07-02 — End: 2019-12-09

## 2019-07-02 NOTE — Telephone Encounter (Signed)
Patient given detailed instructions per Myocardial Perfusion Study Information Sheet for the test on 07/09/19 at 7:45. Patient notified to arrive 15 minutes early and that it is imperative to arrive on time for appointment to keep from having the test rescheduled.  If you need to cancel or reschedule your appointment, please call the office within 24 hours of your appointment. . Patient verbalized understanding.Nathaniel Leach

## 2019-07-02 NOTE — Progress Notes (Signed)
Cardiology Office Note:    Date:  07/02/2019   ID:  Nathaniel Leach, DOB 1940-10-29, MRN [id number]  PCP:  Street, Sharon Mt, MD  Cardiologist:  Jenean Lindau, MD   Referring MD: [address], [place] Mt, *    ASSESSMENT:    1. Coronary artery disease involving native coronary artery of native heart without angina pectoris   2. Mixed hyperlipidemia   3. S/P CABG (coronary artery bypass graft)   4. Dyslipidemia   5. Stage 3 chronic kidney disease, unspecified whether stage 3a or 3b CKD   6. PAF (paroxysmal atrial fibrillation) ([place])   7. Dyspnea on exertion    PLAN:    In order of problems listed above:  1. Coronary artery disease: Secondary prevention stressed with the patient.  Importance of compliance with diet and medication stressed and he vocalized understanding.  His shortness of breath may be an anginal equivalent and for this reason I will set him up for a Lexiscan sestamibi.  Echocardiogram will be done to assess murmur heard on auscultation. 2. Patient will have complete blood work today.  He will not have lipids as he has had breakfast.  The chances of pulmonary thromboembolism are low because he takes his blood thinner anticoagulant Eliquis very regularly for atrial fibrillation. 3. Paroxysmal atrial fibrillation:I discussed with the patient atrial fibrillation, disease process. Management and therapy including rate and rhythm control, anticoagulation benefits and potential risks were discussed extensively with the patient. Patient had multiple questions which were answered to patient's satisfaction. 4. I am not sure whether this has any relation to atrial fibrillation because his heart rates are well controlled and his primary care physician recently increased his beta-blocker. 5. He feels diuretic helps him but now torsemide is not.  I will switch her to metolazone 2.5 mg daily. 6. Patient will have blood work today as mentioned above 7. I discussed his case  extensively with primary care physician.  Patient has bilateral prolonged expiration and wheezing and this might contribute to shortness of breath and Dr. Rex Kras we will send him for an inhaler and will get a pulmonary consultation. 8. Total time for this evaluation was 30 minutes.  He knows to go to the nearest emergency room for any concerning symptoms.   Medication Adjustments/Labs and Tests Ordered: Current medicines are reviewed at length with the patient today.  Concerns regarding medicines are outlined above.  No orders of the defined types were placed in this encounter.  No orders of the defined types were placed in this encounter.    No chief complaint on file.    History of Present Illness:    Nathaniel Leach is a 79 y.o. male.  Patient has past medical history of coronary artery disease post CABG surgery, essential hypertension dyslipidemia and paroxysmal atrial fibrillation.  He sees my partner Dr. Bettina Gavia and is here for follow-up.  He complains of shortness of breath on exertion.  No chest pain orthopnea or PND but this has been happening over the past several weeks.  He saw his primary care doctor for this.  He has a chest x-ray and a CAT scan according to the history provided by the patient and he was told that it was fine.  At the time of my evaluation, the patient is alert awake oriented and in no distress.  Past Medical History:  Diagnosis Date  . Arrhythmia   . CHF (congestive heart failure) ([place])   . Chronic kidney disease   . Hyperlipidemia   .  Hypertension     Past Surgical History:  Procedure Laterality Date  . APPENDECTOMY    . CATARACT EXTRACTION    . CHOLECYSTECTOMY    . CORONARY ARTERY BYPASS GRAFT  2004  . MOUTH SURGERY    . RHINOPLASTY      Current Medications: Current Meds  Medication Sig  . Alogliptin Benzoate 25 MG TABS Take 12.5 mg by mouth daily.  Marland Kitchen apixaban (ELIQUIS) 5 MG TABS tablet Take 5 mg by mouth 2 (two) times daily.  Marland Kitchen atorvastatin  (LIPITOR) 80 MG tablet Take 80 mg by mouth daily.   . citalopram (CELEXA) 20 MG tablet Take 10 mg by mouth daily.  . fluticasone (FLONASE) 50 MCG/ACT nasal spray Place into the nose.  Marland Kitchen glipiZIDE (GLUCOTROL) 10 MG tablet Take 10 mg by mouth 2 (two) times daily before a meal.   . metoprolol tartrate (LOPRESSOR) 50 MG tablet Take 25 mg by mouth 2 (two) times daily.  . Multiple Vitamin (MULTIVITAMIN) capsule Take 1 capsule by mouth daily.   . Omega-3 Fatty Acids (FISH OIL PO) Take by mouth.  Marland Kitchen omeprazole (PRILOSEC) 20 MG capsule Take 20 mg by mouth daily.  . vitamin E 400 UNIT capsule Take 400 Units by mouth daily.  . [DISCONTINUED] torsemide (DEMADEX) 20 MG tablet Take 1.5 tablets (30 mg) twice daily. If you weight is 254 or greater, take 2 tablets (40 mg) twice daily.     Allergies:   Sertraline   Social History   Socioeconomic History  . Marital status: Married    Spouse name: Not on file  . Number of children: Not on file  . Years of education: Not on file  . Highest education level: Not on file  Occupational History  . Not on file  Tobacco Use  . Smoking status: Former Research scientist (life sciences)  . Smokeless tobacco: Former Systems developer    Types: Chew  Substance and Sexual Activity  . Alcohol use: Not Currently  . Drug use: Never  . Sexual activity: Not on file  Other Topics Concern  . Not on file  Social History Narrative  . Not on file   Social Determinants of Health   Financial Resource Strain:   . Difficulty of Paying Living Expenses:   Food Insecurity:   . Worried About Charity fundraiser in the Last Year:   . Arboriculturist in the Last Year:   Transportation Needs:   . Film/video editor (Medical):   Marland Kitchen Lack of Transportation (Non-Medical):   Physical Activity:   . Days of Exercise per Week:   . Minutes of Exercise per Session:   Stress:   . Feeling of Stress :   Social Connections:   . Frequency of Communication with Friends and Family:   . Frequency of Social Gatherings with  Friends and Family:   . Attends Religious Services:   . Active Member of Clubs or Organizations:   . Attends Archivist Meetings:   Marland Kitchen Marital Status:      Family History: The patient's family history includes CAD in his father; Diabetes in his brother; Stroke in his mother.  ROS:   Please see the history of present illness.    All other systems reviewed and are negative.  EKGs/Labs/Other Studies Reviewed:    The following studies were reviewed today: I discussed my findings with the patient at extensive length   Recent Labs: No results found for requested labs within last 8760 hours.  Recent Lipid Panel  Component Value Date/Time   CHOL 128 10/08/2016 1110   TRIG 258 (H) 10/08/2016 1110   HDL 36 (L) 10/08/2016 1110   CHOLHDL 3.6 10/08/2016 1110   LDLCALC 40 10/08/2016 1110    Physical Exam:    VS:  BP 120/70   Pulse 84   Ht 5\' 11"  (1.803 m)   Wt 227 lb (103 kg)   SpO2 95%   BMI 31.66 kg/m     Wt Readings from Last 3 Encounters:  07/02/19 227 lb (103 kg)  04/01/19 245 lb (111.1 kg)  03/19/18 261 lb 3.2 oz (118.5 kg)     GEN: Patient is in no acute distress HEENT: Normal NECK: No JVD; No carotid bruits LYMPHATICS: No lymphadenopathy CARDIAC: Hear sounds regular, 2/6 systolic murmur at the apex. RESPIRATORY:  Clear to auscultation without rales, wheezing or rhonchi  ABDOMEN: Soft, non-tender, non-distended MUSCULOSKELETAL:  No edema; No deformity  SKIN: Warm and dry NEUROLOGIC:  Alert and oriented x 3 PSYCHIATRIC:  Normal affect   Signed, Jenean Lindau, MD  07/02/2019 8:38 AM    [place]

## 2019-07-02 NOTE — Patient Instructions (Signed)
Medication Instructions:  Your physician has recommended you make the following change in your medication:   Stop torsemide Start Metolazone 2.5 mg daily  *If you need a refill on your cardiac medications before your next appointment, please call your pharmacy*   Lab Work: Your physician recommends that you have labs today. You had a Bmet, cbc, tsh, lft and BNP.  If you have labs (blood work) drawn today and your tests are completely normal, you will receive your results only by: Marland Kitchen MyChart Message (if you have MyChart) OR . A paper copy in the mail If you have any lab test that is abnormal or we need to change your treatment, we will call you to review the results.   Testing/Procedures: Your physician has requested that you have an echocardiogram. Echocardiography is a painless test that uses sound waves to create images of your heart. It provides your doctor with information about the size and shape of your heart and how well your heart's chambers and valves are working. This procedure takes approximately one hour. There are no restrictions for this procedure.  Your physician has requested that you have a lexiscan myoview. For further information please visit [email or web address]. Please follow instruction sheet, as given.  The test will take approximately 3 to 4 hours to complete; you may bring reading material.  If someone comes with you to your appointment, they will need to remain in the main lobby due to limited space in the testing area. **If you are pregnant or breastfeeding, please notify the nuclear lab prior to your appointment**  How to prepare for your Myocardial Perfusion Test: . Do not eat or drink 3 hours prior to your test, except you may have water. . Do not consume products containing caffeine (regular or decaffeinated) 12 hours prior to your test. (ex: coffee, chocolate, sodas, tea). . Do bring a list of your current medications with you.  If not listed below, you may  take your medications as normal. . Do wear comfortable clothes (no dresses or overalls) and walking shoes, tennis shoes preferred (No heels or open toe shoes are allowed). . Do NOT wear cologne, perfume, aftershave, or lotions (deodorant is allowed). . If these instructions are not followed, your test will have to be rescheduled.    Follow-Up: At [facility], you and your health needs are our priority.  As part of our continuing mission to provide you with exceptional heart care, we have created designated Provider Care Teams.  These Care Teams include your primary Cardiologist (physician) and Advanced Practice Providers (APPs -  Physician Assistants and Nurse Practitioners) who all work together to provide you with the care you need, when you need it.  We recommend signing up for the patient portal called "MyChart".  Sign up information is provided on this After Visit Summary.  MyChart is used to connect with patients for Virtual Visits (Telemedicine).  Patients are able to view lab/test results, encounter notes, upcoming appointments, etc.  Non-urgent messages can be sent to your provider as well.   To learn more about what you can do with MyChart, go to [email or web address].    Your next appointment:   1 week(s)  The format for your next appointment:   In Person  Provider:   Jyl Heinz, MD   Other Instructions  Echocardiogram An echocardiogram is a procedure that uses painless sound waves (ultrasound) to produce an image of the heart. Images from an echocardiogram can provide important information about:  Signs of coronary  artery disease (CAD).  Aneurysm detection. An aneurysm is a weak or damaged part of an artery wall that bulges out from the normal force of blood pumping through the body.  Heart size and shape. Changes in the size or shape of the heart can be associated with certain conditions, including heart failure, aneurysm, and CAD.  Heart muscle  function.  Heart valve function.  Signs of a past heart attack.  Fluid buildup around the heart.  Thickening of the heart muscle.  A tumor or infectious growth around the heart valves. Tell a health care provider about:  Any allergies you have.  All medicines you are taking, including vitamins, herbs, eye drops, creams, and over-the-counter medicines.  Any blood disorders you have.  Any surgeries you have had.  Any medical conditions you have.  Whether you are pregnant or may be pregnant. What are the risks? Generally, this is a safe procedure. However, problems may occur, including:  Allergic reaction to dye (contrast) that may be used during the procedure. What happens before the procedure? No specific preparation is needed. You may eat and drink normally. What happens during the procedure?   An IV tube may be inserted into one of your veins.  You may receive contrast through this tube. A contrast is an injection that improves the quality of the pictures from your heart.  A gel will be applied to your chest.  A wand-like tool (transducer) will be moved over your chest. The gel will help to transmit the sound waves from the transducer.  The sound waves will harmlessly bounce off of your heart to allow the heart images to be captured in real-time motion. The images will be recorded on a computer. The procedure may vary among health care providers and hospitals. What happens after the procedure?  You may return to your normal, everyday life, including diet, activities, and medicines, unless your health care provider tells you not to do that. Summary  An echocardiogram is a procedure that uses painless sound waves (ultrasound) to produce an image of the heart.  Images from an echocardiogram can provide important information about the size and shape of your heart, heart muscle function, heart valve function, and fluid buildup around your heart.  You do not need to do  anything to prepare before this procedure. You may eat and drink normally.  After the echocardiogram is completed, you may return to your normal, everyday life, unless your health care provider tells you not to do that. This information is not intended to replace advice given to you by your health care provider. Make sure you discuss any questions you have with your health care provider. Document Revised: 06/05/2018 Document Reviewed: 03/17/2016 Elsevier Patient Education  [place].

## 2019-07-03 ENCOUNTER — Encounter: Payer: Self-pay | Admitting: Gastroenterology

## 2019-07-03 ENCOUNTER — Ambulatory Visit (INDEPENDENT_AMBULATORY_CARE_PROVIDER_SITE_OTHER): Payer: Medicare Other

## 2019-07-03 DIAGNOSIS — R0600 Dyspnea, unspecified: Secondary | ICD-10-CM | POA: Diagnosis not present

## 2019-07-03 NOTE — Progress Notes (Signed)
Complete echocardiogram has been performed.  Jimmy Josemiguel Gries RDCS, RVT 

## 2019-07-04 LAB — CBC WITH DIFFERENTIAL/PLATELET
Basophils Absolute: 0.1 10*3/uL (ref 0.0–0.2)
Basos: 1 %
EOS (ABSOLUTE): 0.3 10*3/uL (ref 0.0–0.4)
Eos: 5 %
Hematocrit: 42 % (ref 37.5–51.0)
Hemoglobin: 13.7 g/dL (ref 13.0–17.7)
Immature Grans (Abs): 0 10*3/uL (ref 0.0–0.1)
Immature Granulocytes: 0 %
Lymphocytes Absolute: 1.5 10*3/uL (ref 0.7–3.1)
Lymphs: 26 %
MCH: 30.2 pg (ref 26.6–33.0)
MCHC: 32.6 g/dL (ref 31.5–35.7)
MCV: 93 fL (ref 79–97)
Monocytes Absolute: 0.7 10*3/uL (ref 0.1–0.9)
Monocytes: 12 %
Neutrophils Absolute: 3.2 10*3/uL (ref 1.4–7.0)
Neutrophils: 56 %
Platelets: 249 10*3/uL (ref 150–450)
RBC: 4.54 x10E6/uL (ref 4.14–5.80)
RDW: 13.2 % (ref 11.6–15.4)
WBC: 5.8 10*3/uL (ref 3.4–10.8)

## 2019-07-04 LAB — HEPATIC FUNCTION PANEL
ALT: 23 IU/L (ref 0–44)
AST: 25 IU/L (ref 0–40)
Albumin: 4.6 g/dL (ref 3.7–4.7)
Alkaline Phosphatase: 70 IU/L (ref 39–117)
Bilirubin Total: 0.8 mg/dL (ref 0.0–1.2)
Bilirubin, Direct: 0.3 mg/dL (ref 0.00–0.40)
Total Protein: 7.1 g/dL (ref 6.0–8.5)

## 2019-07-04 LAB — BASIC METABOLIC PANEL
BUN/Creatinine Ratio: 7 — ABNORMAL LOW (ref 10–24)
BUN: 12 mg/dL (ref 8–27)
CO2: 27 mmol/L (ref 20–29)
Calcium: 9.4 mg/dL (ref 8.6–10.2)
Chloride: 98 mmol/L (ref 96–106)
Creatinine, Ser: 1.61 mg/dL — ABNORMAL HIGH (ref 0.76–1.27)
GFR calc Af Amer: 46 mL/min/{1.73_m2} — ABNORMAL LOW (ref >59)
GFR calc non Af Amer: 40 mL/min/{1.73_m2} — ABNORMAL LOW (ref >59)
Glucose: 189 mg/dL — ABNORMAL HIGH (ref 65–99)
Potassium: 4.5 mmol/L (ref 3.5–5.2)
Sodium: 138 mmol/L (ref 134–144)

## 2019-07-04 LAB — TSH: TSH: 2.52 u[IU]/mL (ref 0.450–4.500)

## 2019-07-04 LAB — BRAIN NATRIURETIC PEPTIDE: BNP: 35.8 pg/mL (ref 0.0–100.0)

## 2019-07-07 DIAGNOSIS — J300 Vasomotor rhinitis: Secondary | ICD-10-CM

## 2019-07-07 HISTORY — DX: Vasomotor rhinitis: J30.0

## 2019-07-08 ENCOUNTER — Other Ambulatory Visit: Payer: Self-pay

## 2019-07-08 ENCOUNTER — Encounter: Payer: Self-pay | Admitting: *Deleted

## 2019-07-09 ENCOUNTER — Ambulatory Visit (INDEPENDENT_AMBULATORY_CARE_PROVIDER_SITE_OTHER): Payer: Medicare Other

## 2019-07-09 ENCOUNTER — Other Ambulatory Visit: Payer: Self-pay

## 2019-07-09 ENCOUNTER — Encounter: Payer: Self-pay | Admitting: Cardiology

## 2019-07-09 ENCOUNTER — Ambulatory Visit (INDEPENDENT_AMBULATORY_CARE_PROVIDER_SITE_OTHER): Payer: Medicare Other | Admitting: Cardiology

## 2019-07-09 VITALS — BP 140/80 | HR 88 | Ht 71.0 in | Wt 226.0 lb

## 2019-07-09 DIAGNOSIS — R0600 Dyspnea, unspecified: Secondary | ICD-10-CM | POA: Diagnosis not present

## 2019-07-09 DIAGNOSIS — I2510 Atherosclerotic heart disease of native coronary artery without angina pectoris: Secondary | ICD-10-CM | POA: Diagnosis not present

## 2019-07-09 DIAGNOSIS — R0609 Other forms of dyspnea: Secondary | ICD-10-CM

## 2019-07-09 DIAGNOSIS — Z951 Presence of aortocoronary bypass graft: Secondary | ICD-10-CM | POA: Diagnosis not present

## 2019-07-09 DIAGNOSIS — I480 Paroxysmal atrial fibrillation: Secondary | ICD-10-CM

## 2019-07-09 DIAGNOSIS — N183 Chronic kidney disease, stage 3 unspecified: Secondary | ICD-10-CM | POA: Diagnosis not present

## 2019-07-09 LAB — MYOCARDIAL PERFUSION IMAGING
LV dias vol: 65 mL (ref 62–150)
LV sys vol: 28 mL
Peak HR: 118 {beats}/min
Rest HR: 75 {beats}/min
SDS: 2
SRS: 1
SSS: 3
TID: 1.03

## 2019-07-09 MED ORDER — TECHNETIUM TC 99M TETROFOSMIN IV KIT
32.9000 | PACK | Freq: Once | INTRAVENOUS | Status: AC | PRN
  Administered 2019-07-09: 32.9 via INTRAVENOUS

## 2019-07-09 MED ORDER — TECHNETIUM TC 99M TETROFOSMIN IV KIT
10.4000 | PACK | Freq: Once | INTRAVENOUS | Status: AC | PRN
Start: 2019-07-09 — End: 2019-07-09
  Administered 2019-07-09: 10.4 via INTRAVENOUS

## 2019-07-09 MED ORDER — DILTIAZEM HCL ER COATED BEADS 120 MG PO CP24
120.0000 mg | ORAL_CAPSULE | Freq: Every day | ORAL | 6 refills | Status: DC
Start: 2019-07-09 — End: 2019-09-02

## 2019-07-09 MED ORDER — REGADENOSON 0.4 MG/5ML IV SOLN
0.4000 mg | Freq: Once | INTRAVENOUS | Status: AC
  Administered 2019-07-09: 0.4 mg via INTRAVENOUS

## 2019-07-09 NOTE — Patient Instructions (Signed)
Medication Instructions:  Your physician has recommended you make the following change in your medication:  Stop metoprolol. Start Diltiazem CD 120 mg daily.  *If you need a refill on your cardiac medications before your next appointment, please call your pharmacy*   Lab Work: Your physician recommends that you have a BMET today.  If you have labs (blood work) drawn today and your tests are completely normal, you will receive your results only by: Marland Kitchen MyChart Message (if you have MyChart) OR . A paper copy in the mail If you have any lab test that is abnormal or we need to change your treatment, we will call you to review the results.   Testing/Procedures: None ordered.   Follow-Up: At [facility], you and your health needs are our priority.  As part of our continuing mission to provide you with exceptional heart care, we have created designated Provider Care Teams.  These Care Teams include your primary Cardiologist (physician) and Advanced Practice Providers (APPs -  Physician Assistants and Nurse Practitioners) who all work together to provide you with the care you need, when you need it.  We recommend signing up for the patient portal called "MyChart".  Sign up information is provided on this After Visit Summary.  MyChart is used to connect with patients for Virtual Visits (Telemedicine).  Patients are able to view lab/test results, encounter notes, upcoming appointments, etc.  Non-urgent messages can be sent to your provider as well.   To learn more about what you can do with MyChart, go to [email or web address].    Your next appointment:   2 week(s)  The format for your next appointment:   In Person  Provider:   Jyl Heinz, MD   Other Instructions NA

## 2019-07-09 NOTE — Progress Notes (Signed)
Cardiology Office Note:    Date:  07/09/2019   ID:  Nathaniel Leach, DOB [birth year], MRN [id number]  PCP:  Street, [place], MD  Cardiologist:  Jenean Lindau, MD   Referring MD: [address] [place] Mt, *    ASSESSMENT:    1. Coronary artery disease involving native coronary artery of native heart without angina pectoris   2. PAF (paroxysmal atrial fibrillation) (HCC)   3. Stage 3 chronic kidney disease, unspecified whether stage 3a or 3b CKD   4. Dyspnea on exertion   5. S/P CABG (coronary artery bypass graft)    PLAN:    In order of problems listed above:  1. Coronary artery disease because then I said coronary coronary artery disease: Secondary prevention stressed with the patient.  Importance of compliance with diet and medication stressed and he vocalized understanding.  Because of his dyspnea on exertion I did a Lexiscan sestamibi and this is unremarkable.  Report mentioned below. 2. Essential hypertension: Blood pressure stable 3. Paroxysmal atrial fibrillation patient is on anticoagulation and is compliant with it.  Benefits and potential is explained and he vocalized understanding. 4. Renal insufficiency: Diuretic is working well and he is happy about his progress we will do a Chem-7 today. 5. For his dyspnea on exertion he is also due to see a pulmonologist and he will keep his appointment.  He is inhaler given by his primary care physician is also helping him.  His lungs sound much better today. 6. He will be seen in follow-up appointment 2 weeks or earlier if he has any concerns.  He knows to go to nearest emergency room for any concerning symptoms.   Medication Adjustments/Labs and Tests Ordered: Current medicines are reviewed at length with the patient today.  Concerns regarding medicines are outlined above.  No orders of the defined types were placed in this encounter.  No orders of the defined types were placed in this encounter.    Chief Complaint   Patient presents with  . Follow-up     History of Present Illness:    Nathaniel Leach is a 79 y.o. male.  Patient has past medical history of essential hypertension, dyslipidemia, coronary artery disease post bypass surgery, proximal fibrillation and COPD.  I saw him a few days ago when he had significant bilateral wheezing and Dr. His primary care doctor about this.  We also changed his diuretic and he is responding well.  He denies any problems at this time and takes care of activities of daily living.  No chest pain orthopnea or PND.  His breathing is better according to the patient.  Past Medical History:  Diagnosis Date  . Arrhythmia   . CHF (congestive heart failure) ([place])   . Chronic kidney disease   . Hyperlipidemia   . Hypertension     Past Surgical History:  Procedure Laterality Date  . APPENDECTOMY    . CATARACT EXTRACTION    . CHOLECYSTECTOMY    . CORONARY ARTERY BYPASS GRAFT  2004  . MOUTH SURGERY    . RHINOPLASTY      Current Medications: Current Meds  Medication Sig  . Alogliptin Benzoate 25 MG TABS Take 12.5 mg by mouth daily.  Marland Kitchen apixaban (ELIQUIS) 5 MG TABS tablet Take 5 mg by mouth 2 (two) times daily.  Marland Kitchen atorvastatin (LIPITOR) 80 MG tablet Take 80 mg by mouth daily.   . citalopram (CELEXA) 20 MG tablet Take 10 mg by mouth daily.  Marland Kitchen glipiZIDE (GLUCOTROL) 10 MG  tablet Take 10 mg by mouth 2 (two) times daily before a meal.   . metolazone (ZAROXOLYN) 2.5 MG tablet Take 1 tablet (2.5 mg total) by mouth daily.  . metoprolol tartrate (LOPRESSOR) 50 MG tablet Take 25 mg by mouth 2 (two) times daily.  . Multiple Vitamin (MULTIVITAMIN) capsule Take 1 capsule by mouth daily.   . Omega-3 Fatty Acids (FISH OIL PO) Take by mouth.  Marland Kitchen omeprazole (PRILOSEC) 20 MG capsule Take 20 mg by mouth daily.  . vitamin E 400 UNIT capsule Take 400 Units by mouth daily.     Allergies:   Sertraline   Social History   Socioeconomic History  . Marital status: Married    Spouse  name: Not on file  . Number of children: Not on file  . Years of education: Not on file  . Highest education level: Not on file  Occupational History  . Not on file  Tobacco Use  . Smoking status: Former Research scientist (life sciences)  . Smokeless tobacco: Former Systems developer    Types: Chew  Substance and Sexual Activity  . Alcohol use: Not Currently  . Drug use: Never  . Sexual activity: Not on file  Other Topics Concern  . Not on file  Social History Narrative  . Not on file   Social Determinants of Health   Financial Resource Strain:   . Difficulty of Paying Living Expenses:   Food Insecurity:   . Worried About Charity fundraiser in the Last Year:   . Arboriculturist in the Last Year:   Transportation Needs:   . Film/video editor (Medical):   Marland Kitchen Lack of Transportation (Non-Medical):   Physical Activity:   . Days of Exercise per Week:   . Minutes of Exercise per Session:   Stress:   . Feeling of Stress :   Social Connections:   . Frequency of Communication with Friends and Family:   . Frequency of Social Gatherings with Friends and Family:   . Attends Religious Services:   . Active Member of Clubs or Organizations:   . Attends Archivist Meetings:   Marland Kitchen Marital Status:      Family History: The patient's family history includes CAD in his father; Diabetes in his brother; Stroke in his mother.  ROS:   Please see the history of present illness.    All other systems reviewed and are negative.  EKGs/Labs/Other Studies Reviewed:    The following studies were reviewed today: I discussed my findings with the patient at length.  Stress test was done today and was within normal limits  [place] Ropp GATED SPECT MYO PERF [facility] STRESS 1D Order# [id number] Reading physician: Jenean Lindau, MD Ordering physician: Jenean Lindau, MD Study date: 07/09/19  Patient Information  Name MRN Description  Nathaniel Leach [id number] 79 y.o. male  MyChart Results Release  MyChart  Status: Pending Results Release  Vitals  Height Weight BMI (Calculated)  5\' 11"  (1.803 m) 226 lb (102.5 kg) 31.53  Study Highlights   The left ventricular ejection fraction is normal (55-65%).  Nuclear stress EF: 57%.  There was no ST segment deviation noted during stress.  The study is normal.  This is a low risk study.      Recent Labs: 07/02/2019: ALT 23; BNP 35.8; BUN 12; Creatinine, Ser 1.61; Hemoglobin 13.7; Platelets 249; Potassium 4.5; Sodium 138; TSH 2.520  Recent Lipid Panel    Component Value Date/Time   CHOL 128 10/08/2016 1110  TRIG 258 (H) 10/08/2016 1110   HDL 36 (L) 10/08/2016 1110   CHOLHDL 3.6 10/08/2016 1110   LDLCALC 40 10/08/2016 1110    Physical Exam:    VS:  BP 140/80   Pulse 88   Ht 5\' 11"  (1.803 m)   Wt 226 lb (102.5 kg)   SpO2 98%   BMI 31.52 kg/m     Wt Readings from Last 3 Encounters:  07/09/19 226 lb (102.5 kg)  07/09/19 227 lb (103 kg)  07/02/19 227 lb (103 kg)     GEN: Patient is in no acute distress HEENT: Normal NECK: No JVD; No carotid bruits LYMPHATICS: No lymphadenopathy CARDIAC: Hear sounds regular, 2/6 systolic murmur at the apex. RESPIRATORY:  Clear to auscultation without rales, wheezing or rhonchi  ABDOMEN: Soft, non-tender, non-distended MUSCULOSKELETAL:  No edema; No deformity  SKIN: Warm and dry NEUROLOGIC:  Alert and oriented x 3 PSYCHIATRIC:  Normal affect   Signed, Jenean Lindau, MD  07/09/2019 11:23 AM    [facility]

## 2019-07-10 LAB — BASIC METABOLIC PANEL
BUN/Creatinine Ratio: 15 (ref 10–24)
BUN: 20 mg/dL (ref 8–27)
CO2: 25 mmol/L (ref 20–29)
Calcium: 9.6 mg/dL (ref 8.6–10.2)
Chloride: 94 mmol/L — ABNORMAL LOW (ref 96–106)
Creatinine, Ser: 1.3 mg/dL — ABNORMAL HIGH (ref 0.76–1.27)
GFR calc Af Amer: 60 mL/min/{1.73_m2} (ref >59)
GFR calc non Af Amer: 52 mL/min/{1.73_m2} — ABNORMAL LOW (ref >59)
Glucose: 205 mg/dL — ABNORMAL HIGH (ref 65–99)
Potassium: 4.6 mmol/L (ref 3.5–5.2)
Sodium: 136 mmol/L (ref 134–144)

## 2019-07-21 ENCOUNTER — Other Ambulatory Visit: Payer: Medicare Other

## 2019-07-23 ENCOUNTER — Other Ambulatory Visit: Payer: Self-pay

## 2019-07-23 ENCOUNTER — Encounter: Payer: Self-pay | Admitting: Cardiology

## 2019-07-23 ENCOUNTER — Ambulatory Visit (INDEPENDENT_AMBULATORY_CARE_PROVIDER_SITE_OTHER): Payer: Medicare Other | Admitting: Cardiology

## 2019-07-23 VITALS — BP 100/60 | HR 84 | Ht 71.0 in | Wt 224.0 lb

## 2019-07-23 DIAGNOSIS — Z7901 Long term (current) use of anticoagulants: Secondary | ICD-10-CM | POA: Diagnosis not present

## 2019-07-23 DIAGNOSIS — Z951 Presence of aortocoronary bypass graft: Secondary | ICD-10-CM

## 2019-07-23 DIAGNOSIS — E785 Hyperlipidemia, unspecified: Secondary | ICD-10-CM | POA: Diagnosis not present

## 2019-07-23 DIAGNOSIS — I480 Paroxysmal atrial fibrillation: Secondary | ICD-10-CM | POA: Diagnosis not present

## 2019-07-23 DIAGNOSIS — I2510 Atherosclerotic heart disease of native coronary artery without angina pectoris: Secondary | ICD-10-CM | POA: Diagnosis not present

## 2019-07-23 MED ORDER — DIGOXIN 125 MCG PO TABS
0.1250 mg | ORAL_TABLET | Freq: Every day | ORAL | 3 refills | Status: DC
Start: 2019-07-23 — End: 2019-09-02

## 2019-07-23 NOTE — Patient Instructions (Addendum)
Medication Instructions:  Your physician has recommended you make the following change in your medication:   Stop Diltiazem. Start Digoxin 0.125 mg daily.   *If you need a refill on your cardiac medications before your next appointment, please call your pharmacy*   Lab Work: Your physician recommends that you return for lab work in: 2 weeks for a bmet and digoxin level. DO NOT TAKE YOUR DIGOXIN PRIOR TO YOUR LABS.  If you have labs (blood work) drawn today and your tests are completely normal, you will receive your results only by: Marland Kitchen MyChart Message (if you have MyChart) OR . A paper copy in the mail If you have any lab test that is abnormal or we need to change your treatment, we will call you to review the results.   Testing/Procedures: None ordered   Follow-Up: At [facility], you and your health needs are our priority.  As part of our continuing mission to provide you with exceptional heart care, we have created designated Provider Care Teams.  These Care Teams include your primary Cardiologist (physician) and Advanced Practice Providers (APPs -  Physician Assistants and Nurse Practitioners) who all work together to provide you with the care you need, when you need it.  We recommend signing up for the patient portal called "MyChart".  Sign up information is provided on this After Visit Summary.  MyChart is used to connect with patients for Virtual Visits (Telemedicine).  Patients are able to view lab/test results, encounter notes, upcoming appointments, etc.  Non-urgent messages can be sent to your provider as well.   To learn more about what you can do with MyChart, go to [email or web address].    Your next appointment:   1 month(s)  The format for your next appointment:   In Person  Provider:   Jyl Heinz, MD   Other Instructions NA

## 2019-07-23 NOTE — Progress Notes (Signed)
Cardiology Office Note:    Date:  07/23/2019   ID:  Nathaniel Leach, DOB 30-Jan-1941, MRN [id number]  PCP:  Street, Sharon Mt, MD  Cardiologist:  Jenean Lindau, MD   Referring MD: [address], [place], *    ASSESSMENT:    1. Coronary artery disease involving native coronary artery of native heart without angina pectoris   2. PAF (paroxysmal atrial fibrillation) ([place])   3. Chronic anticoagulation   4. Dyslipidemia   5. S/P CABG (coronary artery bypass graft)    PLAN:    In order of problems listed above:  1. Coronary artery disease: Secondary prevention stressed to the patient.  Importance of compliance with diet and medication stressed and she vocalized understanding 2. Paroxysmal atrial fibrillation and hypotension: I discussed my findings with the patient at length.  Currently has persistent atrial fibrillation.  In view of this following recommendations were made.  I stopped his Cardizem and initiated him on digoxin 0.125 mg daily he will be back in 2 weeks for blood work which is Chem-7 and this level.  Hopefully this will help his episodes of hypotension which are mild but are of concern to him. 3. He is yet here for pulmonologist.  Also results of stress test and echocardiogram were discussed with him at length and he understood.  His diuretic is helping him and therefore I do not want to make any changes at this time. 4. Renal insufficiency: We will continue to monitor.  Last blood work was better than the previous ones. 5. Follow-up appointment in a month or earlier if he has any concerns.  He knows to go to nearest emergency room for any concerning symptoms.   Medication Adjustments/Labs and Tests Ordered: Current medicines are reviewed at length with the patient today.  Concerns regarding medicines are outlined above.  No orders of the defined types were placed in this encounter.  No orders of the defined types were placed in this encounter.    Chief Complaint   Patient presents with  . Follow-up     History of Present Illness:    Nathaniel Leach is a 79 y.o. male.  Patient has past medical history of atrial fibrillation, coronary artery disease.  He was initiated on anticoagulation and also is on Cardizem.  Because of symptoms suggesting postural hypotension he is a little concerned about feeling dizzy when he sits up.  No chest pain orthopnea or PND.  He is tolerating diuretic well and this is helping him.  He is yet to hear from the pulmonologist for an appointment.  Past Medical History:  Diagnosis Date  . Arrhythmia   . CHF (congestive heart failure) ([place])   . Chronic kidney disease   . Hyperlipidemia   . Hypertension     Past Surgical History:  Procedure Laterality Date  . APPENDECTOMY    . CATARACT EXTRACTION    . CHOLECYSTECTOMY    . CORONARY ARTERY BYPASS GRAFT  2004  . MOUTH SURGERY    . RHINOPLASTY      Current Medications: Current Meds  Medication Sig  . Alogliptin Benzoate 25 MG TABS Take 12.5 mg by mouth daily.  Marland Kitchen apixaban (ELIQUIS) 5 MG TABS tablet Take 5 mg by mouth 2 (two) times daily.  Marland Kitchen atorvastatin (LIPITOR) 80 MG tablet Take 80 mg by mouth daily.   . citalopram (CELEXA) 20 MG tablet Take 10 mg by mouth daily.  Marland Kitchen diltiazem (CARDIZEM CD) 120 MG 24 hr capsule Take 1 capsule (120 mg  total) by mouth daily.  Marland Kitchen glipiZIDE (GLUCOTROL) 10 MG tablet Take 10 mg by mouth 2 (two) times daily before a meal.   . metolazone (ZAROXOLYN) 2.5 MG tablet Take 1 tablet (2.5 mg total) by mouth daily.  . Multiple Vitamin (MULTIVITAMIN) capsule Take 1 capsule by mouth daily.   . Omega-3 Fatty Acids (FISH OIL PO) Take by mouth.  Marland Kitchen omeprazole (PRILOSEC) 20 MG capsule Take 20 mg by mouth daily.  . vitamin E 400 UNIT capsule Take 400 Units by mouth daily.     Allergies:   Sertraline   Social History   Socioeconomic History  . Marital status: Married    Spouse name: Not on file  . Number of children: Not on file  . Years of  education: Not on file  . Highest education level: Not on file  Occupational History  . Not on file  Tobacco Use  . Smoking status: Former Research scientist (life sciences)  . Smokeless tobacco: Former Systems developer    Types: Chew  Substance and Sexual Activity  . Alcohol use: Not Currently  . Drug use: Never  . Sexual activity: Not on file  Other Topics Concern  . Not on file  Social History Narrative  . Not on file   Social Determinants of Health   Financial Resource Strain:   . Difficulty of Paying Living Expenses:   Food Insecurity:   . Worried About Charity fundraiser in the Last Year:   . Arboriculturist in the Last Year:   Transportation Needs:   . Film/video editor (Medical):   Marland Kitchen Lack of Transportation (Non-Medical):   Physical Activity:   . Days of Exercise per Week:   . Minutes of Exercise per Session:   Stress:   . Feeling of Stress :   Social Connections:   . Frequency of Communication with Friends and Family:   . Frequency of Social Gatherings with Friends and Family:   . Attends Religious Services:   . Active Member of Clubs or Organizations:   . Attends Archivist Meetings:   Marland Kitchen Marital Status:      Family History: The patient's family history includes CAD in his father; Diabetes in his brother; Stroke in his mother.  ROS:   Please see the history of present illness.    All other systems reviewed and are negative.  EKGs/Labs/Other Studies Reviewed:    The following studies were reviewed today: I discussed my findings with the patient at length   Recent Labs: 07/02/2019: ALT 23; BNP 35.8; Hemoglobin 13.7; Platelets 249; TSH 2.520 07/09/2019: BUN 20; Creatinine, Ser 1.30; Potassium 4.6; Sodium 136  Recent Lipid Panel    Component Value Date/Time   CHOL 128 10/08/2016 1110   TRIG 258 (H) 10/08/2016 1110   HDL 36 (L) 10/08/2016 1110   CHOLHDL 3.6 10/08/2016 1110   LDLCALC 40 10/08/2016 1110    Physical Exam:    VS:  BP 100/60   Pulse 84   Ht 5\' 11"  (1.803 m)    Wt 224 lb (101.6 kg)   SpO2 93%   BMI 31.24 kg/m     Wt Readings from Last 3 Encounters:  07/23/19 224 lb (101.6 kg)  07/09/19 226 lb (102.5 kg)  07/09/19 227 lb (103 kg)     GEN: Patient is in no acute distress HEENT: Normal NECK: No JVD; No carotid bruits LYMPHATICS: No lymphadenopathy CARDIAC: Hear sounds irregular, 2/6 systolic murmur at the apex. RESPIRATORY:  Clear to auscultation  without rales, wheezing or rhonchi  ABDOMEN: Soft, non-tender, non-distended MUSCULOSKELETAL:  No edema; No deformity  SKIN: Warm and dry NEUROLOGIC:  Alert and oriented x 3 PSYCHIATRIC:  Normal affect   Signed, Jenean Lindau, MD  07/23/2019 10:21 AM    [place]

## 2019-08-05 ENCOUNTER — Telehealth: Payer: Self-pay

## 2019-08-05 LAB — BASIC METABOLIC PANEL
BUN/Creatinine Ratio: 10 (ref 10–24)
BUN: 10 mg/dL (ref 8–27)
CO2: 25 mmol/L (ref 20–29)
Calcium: 9.2 mg/dL (ref 8.6–10.2)
Chloride: 92 mmol/L — ABNORMAL LOW (ref 96–106)
Creatinine, Ser: 1.05 mg/dL (ref 0.76–1.27)
GFR calc Af Amer: 77 mL/min/{1.73_m2} (ref >59)
GFR calc non Af Amer: 67 mL/min/{1.73_m2} (ref >59)
Glucose: 108 mg/dL — ABNORMAL HIGH (ref 65–99)
Potassium: 3.9 mmol/L (ref 3.5–5.2)
Sodium: 132 mmol/L — ABNORMAL LOW (ref 134–144)

## 2019-08-05 LAB — DIGOXIN LEVEL: Digoxin, Serum: 0.6 ng/mL (ref 0.5–0.9)

## 2019-08-05 NOTE — Telephone Encounter (Signed)
Spoke with patient regarding results and recommendation.  Patient verbalizes understanding and is agreeable to plan of care. Advised patient to call back with any issues or concerns.  

## 2019-08-05 NOTE — Telephone Encounter (Signed)
-----   Message from Jenean Lindau, MD sent at 08/05/2019  1:19 PM EDT ----- The results of the study is unremarkable. Please inform patient. I will discuss in detail at next appointment. Cc  primary care/referring physician Jenean Lindau, MD 08/05/2019 1:19 PM

## 2019-08-27 ENCOUNTER — Ambulatory Visit: Payer: Medicare Other | Admitting: Cardiology

## 2019-09-02 ENCOUNTER — Ambulatory Visit (INDEPENDENT_AMBULATORY_CARE_PROVIDER_SITE_OTHER): Payer: Medicare Other | Admitting: Pulmonary Disease

## 2019-09-02 ENCOUNTER — Encounter: Payer: Self-pay | Admitting: Pulmonary Disease

## 2019-09-02 ENCOUNTER — Other Ambulatory Visit: Payer: Self-pay

## 2019-09-02 DIAGNOSIS — J479 Bronchiectasis, uncomplicated: Secondary | ICD-10-CM | POA: Insufficient documentation

## 2019-09-02 DIAGNOSIS — J471 Bronchiectasis with (acute) exacerbation: Secondary | ICD-10-CM | POA: Diagnosis not present

## 2019-09-02 DIAGNOSIS — K449 Diaphragmatic hernia without obstruction or gangrene: Secondary | ICD-10-CM | POA: Insufficient documentation

## 2019-09-02 DIAGNOSIS — J189 Pneumonia, unspecified organism: Secondary | ICD-10-CM | POA: Diagnosis not present

## 2019-09-02 DIAGNOSIS — R0602 Shortness of breath: Secondary | ICD-10-CM | POA: Diagnosis not present

## 2019-09-02 DIAGNOSIS — I2510 Atherosclerotic heart disease of native coronary artery without angina pectoris: Secondary | ICD-10-CM

## 2019-09-02 MED ORDER — SODIUM CHLORIDE 3 % IN NEBU
INHALATION_SOLUTION | Freq: Every day | RESPIRATORY_TRACT | 2 refills | Status: DC
Start: 2019-09-02 — End: 2020-01-26

## 2019-09-02 NOTE — Assessment & Plan Note (Signed)
Mild bronchiectasis was noted on my review and I wonder if this is the main issue, will obtain high-resolution CT chest in about 6 weeks to clarify, wait for pneumonia to resolve before obtaining this. We will also schedule PFTs to assess for airway obstruction given his prior history of smoking.  Airway clearance will be the main strategy here, I have asked him to use his incentive spirometer and Mucinex. We will try to provide him with a nebulizer and hypertonic saline nebs to get him through this acute exacerbation. After listening to his story and with bibasal crackles I was initially concerned about ILD but review of his CTs does not demonstrate significant ILD.  He does not seem to have significant exposure in his environment.

## 2019-09-02 NOTE — Patient Instructions (Signed)
  CT scan showed bibasilar pneumonia Complete course of antibiotics -Call me if you have persistent colored sputum after completing.  You may have underlying bronchiectasis/damage to airways and postinfectious scarring This may be related to reflux and hiatal hernia  Main treatment strategy here is airway clearance with flutter valve and Mucinex Prescription for nebulizer and 3% saline solution once daily #30 x 2 refills  Schedule high-resolution CT scan in 6 weeks Schedule PFTs in 6 weeks

## 2019-09-02 NOTE — Assessment & Plan Note (Signed)
The bibasilar opacities noted on CT scan seem to suggest community-acquired pneumonia He will complete his course of Augmentin. Underlying bronchiectasis is a possibility

## 2019-09-02 NOTE — Progress Notes (Signed)
Subjective:    Patient ID: Nathaniel Leach, male    DOB: July 30, 1940, 79 y.o.   MRN: [id number]  HPI  79 year old remote smoker presents for evaluation of shortness of breath. He is accompanied by his son Gerald Stabs today. I have reviewed his imaging studies and discharge summary from [facility].  He is convinced that he had Covid infection in February 2020 when he had URI symptoms and lost his sense of smell.  He reports dyspnea that has been slowly progressive since then but worse over the last 6 months.  About a month ago he went to [place] to visit his son and was really struggling there and on his way back had a difficult time at the airport and almost collapsed.  His son took him straight to [facility] where he was admitted 6/32 7/2, oxygen saturation was 88% on arrival and after treatment with steroids, oxygen, antibiotic and nebulizer x1 this improved to about 92% on discharge. He arrived with a saturation of 93% today but on ambulation this improved to 94 to 95% and heart rate increased from 89-1 10  He reports cough and chest congestion with brown sputum, albuterol MDI seems to help.  He is completing his course of Augmentin  I also note chest x-ray from 08/2017 which makes mention of lateral coarse interstitial changes  He reports recurrent pneumonias in the past.  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 He smoked about 20 pack years before he quit in 1975  He reports significant reflux symptoms for which he takes Prilosec daily  Significant tests/ events reviewed  CT angiogram chest 08/26/2019 patchy predominantly lower lobe opacities bilateral  CT chest with contrast 05/2019 moderate hiatal hernia small clustered nodules left lower lobe, mild scarring left upper lobe and lingula, mild bronchiectasis to my review  CT  chest/abdomen/pelvis 04/2013 clear lungs   Past Medical History:  Diagnosis Date   Acute recurrent maxillary sinusitis 03/30/2019   Anosmia 02/18/2019   APC (atrial premature contractions) 08/28/2016   Arrhythmia    CHF (congestive heart failure) ([place])    Chronic anticoagulation 08/31/2014   Chronic diastolic heart failure ([place]) 08/31/2014   Chronic ethmoidal sinusitis 04/28/2019   Chronic kidney disease    CKD (chronic kidney disease) stage 3, GFR 30-59 ml/min (HCC) 11/23/2016   Coronary artery disease involving native coronary artery of native heart 08/31/2014   Dyslipidemia 08/28/2016   Hyperlipidemia    Hypertension    Hypertensive heart and chronic kidney disease with heart failure and stage 1 through stage 4 chronic kidney disease, or unspecified chronic kidney disease ([place]) 08/31/2014   Microalbuminuria 05/02/2017   Nasal septal deviation 02/18/2019   PAF (paroxysmal atrial fibrillation) ([place]) 08/31/2014   Overview:  rapid rate requiring beta blocker , CCB and amiodorone for rate control   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   Overview:  2004   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     Past Surgical History:  Procedure Laterality Date   APPENDECTOMY     CATARACT EXTRACTION     CHOLECYSTECTOMY     CORONARY ARTERY BYPASS GRAFT  2004   MOUTH SURGERY     RHINOPLASTY      Allergies  Allergen Reactions   Sertraline Other (See Comments)    Jerking and unable to speak well    Social  History   Socioeconomic History   Marital status: Married    Spouse name: Not on file   Number of children: Not on file   Years of education: Not on file   Highest education level: Not on file  Occupational History   Not on file  Tobacco Use   Smoking status: Former Smoker   Smokeless tobacco: Former Systems developer    Types: Chew   Tobacco comment: Quit in [place] Use: Never used  Substance and Sexual Activity   Alcohol use: Not Currently   Drug  use: Never   Sexual activity: Not on file  Other Topics Concern   Not on file  Social History Narrative   Not on file   Social Determinants of Health   Financial Resource Strain:    Difficulty of Paying Living Expenses:   Food Insecurity:    Worried About Charity fundraiser in the Last Year:    Arboriculturist in the Last Year:   Transportation Needs:    Film/video editor (Medical):    Lack of Transportation (Non-Medical):   Physical Activity:    Days of Exercise per Week:    Minutes of Exercise per Session:   Stress:    Feeling of Stress :   Social Connections:    Frequency of Communication with Friends and Family:    Frequency of Social Gatherings with Friends and Family:    Attends Religious Services:    Active Member of Clubs or Organizations:    Attends Music therapist:    Marital Status:   Intimate Partner Violence:    Fear of Current or Ex-Partner:    Emotionally Abused:    Physically Abused:    Sexually Abused:      Family History  Problem Relation Age of Onset   Stroke Mother    CAD Father    Diabetes Brother        Review of Systems Positive shortness of breath, cough with brown sputum, lost 40 pounds in 1 year  Constitutional: negative for anorexia, fevers and sweats  Eyes: negative for irritation, redness and visual disturbance  Ears, nose, mouth, throat, and face: negative for earaches, epistaxis, nasal congestion and sore throat   Cardiovascular: negative for chest pain,  lower extremity edema, orthopnea, palpitations and syncope  Gastrointestinal: negative for abdominal pain, constipation, diarrhea, melena, nausea and vomiting  Genitourinary:negative for dysuria, frequency and hematuria  Hematologic/lymphatic: negative for bleeding, easy bruising and lymphadenopathy  Musculoskeletal:negative for arthralgias, muscle weakness and stiff joints  Neurological: negative for coordination problems, gait  problems, headaches and weakness  Endocrine: negative for diabetic symptoms including polydipsia, polyuria and weight loss     Objective:   Physical Exam  Gen. Pleasant, , in no distress, normal affect ENT - no pallor,icterus, no post nasal drip, class 2-3 airway Neck: No JVD, no thyromegaly, no carotid bruits Lungs: no use of accessory muscles, no dullness to percussion, BLL dry rales no rhonchi  Cardiovascular: Rhythm regular, heart sounds  normal, no murmurs or gallops, no peripheral edema Abdomen: soft and non-tender, no hepatosplenomegaly, BS normal. Musculoskeletal: No deformities, no cyanosis or clubbing Neuro:  alert, non focal, no tremors       Assessment & Plan:

## 2019-09-02 NOTE — Assessment & Plan Note (Signed)
The hiatal hernia and chronic reflux symptoms raises the question of constant reflux and damage to his airways on that basis, we may consider esophagram in the future

## 2019-09-07 ENCOUNTER — Telehealth: Payer: Self-pay | Admitting: *Deleted

## 2019-09-07 ENCOUNTER — Ambulatory Visit: Payer: Medicare Other | Admitting: Gastroenterology

## 2019-09-07 NOTE — Telephone Encounter (Signed)
   [facility] Pre-operative Risk Assessment    Request for surgical clearance:  1. What type of surgery is being performed? Colonoscopy   2. When is this surgery scheduled? 09/24/2019   3. What type of clearance is required (medical clearance vs. Pharmacy clearance to hold med vs. Both)? Pharmacy Clearance  4. Are there any medications that need to be held prior to surgery and how long?Eliquis   5. Practice name and name of physician performing surgery? [facility]  6. What is your office phone number [phone]    7.   What is your office fax number [phone]  8.   Anesthesia type (None, local, MAC, general) ? Unknown   Nathaniel Leach 09/07/2019, 2:22 PM  _________________________________________________________________   (provider comments below)

## 2019-09-07 NOTE — Telephone Encounter (Signed)
   Primary Cardiologist: Jenean Lindau, MD   Chart reviewed as part of pre-operative protocol coverage.  Per pharmacy recommendations, patient can hold eliquis 1-2 days prior to his upcoming colonoscopy with plans to restart when cleared to do so by his gastroenterologist.   I will route this recommendation to the requesting party via [place] fax function and remove from pre-op pool.  Please call with questions.  Abigail Butts, PA-C 09/07/2019, 4:50 PM

## 2019-09-07 NOTE — Telephone Encounter (Signed)
Patient with diagnosis of afib on Eliquis for anticoagulation.    Procedure: Colonoscopy  Date of procedure: 09/24/2019  CHADS2-VASc score of  5 (CHF, HTN, AGE,CAD, AGE)  CrCl 59 ml/min  Per office protocol, patient can hold Eliquis for 1-2 days prior to procedure.

## 2019-09-14 ENCOUNTER — Telehealth: Payer: Self-pay | Admitting: Pulmonary Disease

## 2019-09-14 NOTE — Telephone Encounter (Signed)
Review of his hospital record shows sputum culture positive for Pseudomonas sensitive to Cipro/levofloxacin  Just want to confirm that he is doing better and is not coughing up any more yellow-green sputum. If so he may need another course of antibiotics

## 2019-09-16 ENCOUNTER — Telehealth: Payer: Self-pay | Admitting: Pulmonary Disease

## 2019-09-16 MED ORDER — LEVOFLOXACIN 500 MG PO TABS
500.0000 mg | ORAL_TABLET | Freq: Every day | ORAL | 0 refills | Status: DC
Start: 2019-09-16 — End: 2019-12-09

## 2019-09-16 NOTE — Telephone Encounter (Signed)
Levaquin 500 mg daily for 7 days and report back

## 2019-09-16 NOTE — Telephone Encounter (Signed)
Review of his hospital record shows sputum culture positive for Pseudomonas sensitive to Cipro/levofloxacin  Just want to confirm that he is doing better and is not coughing up any more yellow-green sputum. If so he may need another course of antibiotics  Patient called he is still congested, coughing, phlegm has changed from brown to yellow.   Dr.alva please advise.

## 2019-09-16 NOTE — Telephone Encounter (Signed)
Patient aware and will call us back once abx is complete.

## 2019-09-17 ENCOUNTER — Other Ambulatory Visit: Payer: Self-pay

## 2019-09-17 MED ORDER — DIGOXIN 125 MCG PO TABS
0.1250 mg | ORAL_TABLET | Freq: Every day | ORAL | 3 refills | Status: DC
Start: 2019-09-17 — End: 2020-12-14

## 2019-10-05 ENCOUNTER — Telehealth: Payer: Self-pay | Admitting: Pulmonary Disease

## 2019-10-05 MED ORDER — LEVOFLOXACIN 500 MG PO TABS
500.0000 mg | ORAL_TABLET | Freq: Every day | ORAL | 0 refills | Status: DC
Start: 2019-10-05 — End: 2019-12-09

## 2019-10-05 NOTE — Telephone Encounter (Signed)
I went ahead and sent rx to the pharm  [facility] for the pt to notify of response per Dr Elsworth Soho

## 2019-10-05 NOTE — Telephone Encounter (Signed)
Spoke with the pt and notified of response per Dr Elsworth Soho and he verbalized understanding  He is aware rx has been sent to pharm  Pt to keep upcoming appts

## 2019-10-05 NOTE — Telephone Encounter (Signed)
Patient seen by Dr. Elsworth Soho on 09/02/19 and was diagnosed with Pneumonia and SOB. Patient was given RX for Levaquin 500 mg daily for 7 days on 09/16/19 and was advised to report back. Patient states that he is wheezing and has a cough with tan/yellow sputum. He is scheduled to have his CT scan to follow up from Pneumonia a week from this Wednesday.  Dr. Elsworth Soho please advise and patient is also wondering if you want him to proceed with CT scan.

## 2019-10-05 NOTE — Telephone Encounter (Signed)
He does have bronchiectasis and had Pseudomonas in his sputum so may need a longer course of antibiotics. Please give him another course of Levaquin 500 mg daily for 7 days Okay to proceed with HRCT afterwards

## 2019-10-12 NOTE — Telephone Encounter (Signed)
Patient states he was left a message to call back regarding test results - pt can be reached on [phone] -pr

## 2019-10-12 NOTE — Telephone Encounter (Signed)
These results were already given to patient Not sure what results he had the voicemail on.  Patient states it must have been an old message and he has been out of town the last week  Nothing further needed at this time.

## 2019-10-14 ENCOUNTER — Ambulatory Visit
Admission: RE | Admit: 2019-10-14 | Discharge: 2019-10-14 | Disposition: A | Discharge status: Home / Self Care | Payer: Medicare Other | Source: Ambulatory Visit | Attending: Pulmonary Disease | Admitting: Pulmonary Disease

## 2019-10-14 DIAGNOSIS — R0602 Shortness of breath: Secondary | ICD-10-CM

## 2019-10-19 ENCOUNTER — Telehealth: Payer: Self-pay | Admitting: Pulmonary Disease

## 2019-10-19 NOTE — Telephone Encounter (Signed)
Bibasal pneumonia has resolved. Evidence of airway damage/bronchiectasis as we discussed -which may be related to recurrent reflux and aspiration from hiatal hernia Hope he is feeling better, we will discuss further plans during office visit 9/15

## 2019-10-19 NOTE — Telephone Encounter (Signed)
Spoke with the pt and notified of results per Dr Elsworth Soho and he verbalized understanding.

## 2019-10-19 NOTE — Telephone Encounter (Signed)
Called and spoke to patient, who is requesting CT results.   Dr. Elsworth Soho, please advise. Thanks

## 2019-11-11 ENCOUNTER — Encounter: Payer: Self-pay | Admitting: Pulmonary Disease

## 2019-11-11 ENCOUNTER — Ambulatory Visit (INDEPENDENT_AMBULATORY_CARE_PROVIDER_SITE_OTHER): Payer: Medicare Other

## 2019-11-11 ENCOUNTER — Other Ambulatory Visit: Payer: Self-pay

## 2019-11-11 ENCOUNTER — Ambulatory Visit (INDEPENDENT_AMBULATORY_CARE_PROVIDER_SITE_OTHER): Payer: Medicare Other | Admitting: Pulmonary Disease

## 2019-11-11 VITALS — HR 100 | Temp 98.3°F | Ht 70.0 in | Wt 200.0 lb

## 2019-11-11 DIAGNOSIS — I2510 Atherosclerotic heart disease of native coronary artery without angina pectoris: Secondary | ICD-10-CM

## 2019-11-11 DIAGNOSIS — R0602 Shortness of breath: Secondary | ICD-10-CM | POA: Diagnosis not present

## 2019-11-11 DIAGNOSIS — J440 Chronic obstructive pulmonary disease with acute lower respiratory infection: Secondary | ICD-10-CM | POA: Insufficient documentation

## 2019-11-11 DIAGNOSIS — J471 Bronchiectasis with (acute) exacerbation: Secondary | ICD-10-CM | POA: Diagnosis not present

## 2019-11-11 MED ORDER — PREDNISONE 10 MG PO TABS
10.0000 mg | ORAL_TABLET | Freq: Every day | ORAL | 0 refills | Status: DC
Start: 2019-11-11 — End: 2019-12-09

## 2019-11-11 MED ORDER — STIOLTO RESPIMAT 2.5-2.5 MCG/ACT IN AERS
2.0000 | INHALATION_SPRAY | Freq: Every day | RESPIRATORY_TRACT | 0 refills | Status: DC
Start: 2019-11-11 — End: 2019-12-09

## 2019-11-11 MED ORDER — LEVOFLOXACIN 500 MG PO TABS
500.0000 mg | ORAL_TABLET | Freq: Every day | ORAL | 0 refills | Status: DC
Start: 2019-11-11 — End: 2019-12-09

## 2019-11-11 MED ORDER — ANORO ELLIPTA 62.5-25 MCG/INH IN AEPB
1.0000 | INHALATION_SPRAY | Freq: Every day | RESPIRATORY_TRACT | 0 refills | Status: DC
Start: 2019-11-11 — End: 2019-11-11

## 2019-11-11 NOTE — Progress Notes (Signed)
   Subjective:    Patient ID: Nathaniel Leach, male    DOB: 07-28-1940, 79 y.o.   MRN: [id number]  HPI 79 year old remote smoker with dyspnea on exertion for 6 months and hypoxia -Moderate hiatal hernia -Episode of bilateral lower lobe pneumonia 07/2019 Recently diagnosed with A. fib and placed on anticoagulation  08/2019 sputum culture positive for Pseudomonas sensitive to Cipro/levofloxacin >> levaquin x 14 days  He felt slightly improved after course of Levaquin but was soon back to coughing again.  He brings in a sputum cup today with purulent sputum.  No fevers, breathing is worse with intermittent wheezing. We reviewed CT and PFTs -prior consolidation noted on CT has resolved, significant bronchiectasis  Chest x-ray does not show any infiltrate  Significant tests/ events reviewed HRCT 09/2019 > Mild centrilobular and paraseptal emphysema with prominent diffuse bronchial wall thickening, suggesting COPD. 2. Mild diffuse cylindrical bronchiectasis with moderate patchy tree-in-bud opacities most prominent in the dependent lower lobes. Findings suggest chronic/recurrent bronchiolitis such as due to recurrent aspiration or atypical mycobacterial infection (MAI). 3. Resolved bilateral lower lobe pneumonia seen on 08/26/2019    PFTs 10/2019 moderate airway obstruction, ratio 57, FEV1 1.39/48%, FVC 60%, no bronchodilator response, and DLCO 69%, TLC normal  CT angiogram chest 08/26/2019 patchy predominantly lower lobe opacities bilateral  CT chest with contrast 05/2019 moderate hiatal hernia small clustered nodules left lower lobe, mild scarring left upper lobe and lingula, mild bronchiectasis to my review  CT chest/abdomen/pelvis 04/2013 clear lungs  Review of Systems neg for any significant sore throat, dysphagia, itching, sneezing, nasal congestion or excess/ purulent secretions, fever, chills, sweats, unintended wt loss, pleuritic or exertional cp, hempoptysis, orthopnea pnd or change in  chronic leg swelling. Also denies presyncope, palpitations, heartburn, abdominal pain, nausea, vomiting, diarrhea or change in bowel or urinary habits, dysuria,hematuria, rash, arthralgias, visual complaints, headache, numbness weakness or ataxia.     Objective:   Physical Exam  Gen. Pleasant, well-nourished, in no distress ENT - no thrush, no pallor/icterus,no post nasal drip Neck: No JVD, no thyromegaly, no carotid bruits Lungs: no use of accessory muscles, no dullness to percussion, BL scattered rhonchi , bibasal rales Cardiovascular: Rhythm regular, heart sounds  normal, no murmurs or gallops, no peripheral edema Musculoskeletal: No deformities, no cyanosis or clubbing        Assessment & Plan:

## 2019-11-11 NOTE — Assessment & Plan Note (Addendum)
We will treat as bronchiectatic exacerbation, given prior history of Pseudomonas will use Levaquin but also obtain sputum cultures increase resistance as developed.  Due to bronchospasm, will also need steroids Sputum culture & sputum for afb  Prednisone 10 mg tabs Take 4 tabs  daily with food x 4 days, then 3 tabs daily x 4 days, then 2 tabs daily x 4 days, then 1 tab daily x4 days then stop. #40   Levaquin 500 mg daily x 14 days  For airway clearance, Stay on mucinex & saline nebs twice daily Flutter valve

## 2019-11-11 NOTE — Patient Instructions (Signed)
CXR today Sputum culture & sputum for afb  Prednisone 10 mg tabs Take 4 tabs  daily with food x 4 days, then 3 tabs daily x 4 days, then 2 tabs daily x 4 days, then 1 tab daily x4 days then stop. #40   Levaquin 500 mg daily x 14 days  Stay on mucinex & saline nebs twice daily  Sample of Anoro

## 2019-11-11 NOTE — Progress Notes (Signed)
Full PFT completed today ? ?

## 2019-11-11 NOTE — Assessment & Plan Note (Signed)
PFTs show airway obstruction which may be related to bronchiectasis alone or prior history of smoking. We will trial Stiolto sample, he has to get his medications from the [place] otherwise too expensive.  I am hesitant to start him on nebs due to history of A. fib

## 2019-11-12 ENCOUNTER — Telehealth: Payer: Self-pay | Admitting: Pulmonary Disease

## 2019-11-12 MED ORDER — PREDNISONE 10 MG PO TABS
ORAL_TABLET | ORAL | 0 refills | Status: DC
Start: 2019-11-12 — End: 2019-12-09

## 2019-11-12 MED ORDER — LEVOFLOXACIN 500 MG PO TABS
500.0000 mg | ORAL_TABLET | Freq: Every day | ORAL | 0 refills | Status: DC
Start: 2019-11-12 — End: 2019-12-09

## 2019-11-12 NOTE — Telephone Encounter (Signed)
Spoke with the pt and have corrected rxs for levaquin and pred  Correct quantity sent

## 2019-11-13 NOTE — Progress Notes (Signed)
Called and spoke with patient about xray result per Dr Elsworth Soho. All questions answered and patient expressed full understanding. Patient stated that, currently feeling better. Patient confirmed appointment with Willette Brace NP on 11/25/2019. Nothing further needed at this time.

## 2019-11-16 LAB — RESPIRATORY CULTURE OR RESPIRATORY AND SPUTUM CULTURE
MICRO NUMBER:: 10953927
SPECIMEN QUALITY:: ADEQUATE

## 2019-11-17 NOTE — Progress Notes (Signed)
Called and spoke with patient about sputum results per Dr Elsworth Soho. All questions answered. Patient stated, "I am feeling improvement." Patient is still taking antibiotic and nebs. Patient expressed understanding and confirmed appointment with NP on 11/25/2019. Nothing further needed at this time.

## 2019-11-25 ENCOUNTER — Encounter: Payer: Self-pay | Admitting: Adult Health

## 2019-11-25 ENCOUNTER — Ambulatory Visit (INDEPENDENT_AMBULATORY_CARE_PROVIDER_SITE_OTHER): Payer: Medicare Other

## 2019-11-25 ENCOUNTER — Ambulatory Visit (INDEPENDENT_AMBULATORY_CARE_PROVIDER_SITE_OTHER): Payer: Medicare Other | Admitting: Adult Health

## 2019-11-25 ENCOUNTER — Other Ambulatory Visit: Payer: Self-pay

## 2019-11-25 VITALS — BP 130/66 | HR 117 | Temp 97.5°F | Ht 71.0 in | Wt 201.0 lb

## 2019-11-25 DIAGNOSIS — J471 Bronchiectasis with (acute) exacerbation: Secondary | ICD-10-CM

## 2019-11-25 DIAGNOSIS — J440 Chronic obstructive pulmonary disease with acute lower respiratory infection: Secondary | ICD-10-CM

## 2019-11-25 DIAGNOSIS — I2510 Atherosclerotic heart disease of native coronary artery without angina pectoris: Secondary | ICD-10-CM

## 2019-11-25 DIAGNOSIS — J189 Pneumonia, unspecified organism: Secondary | ICD-10-CM

## 2019-11-25 LAB — PULMONARY FUNCTION TEST
DL/VA % pred: 100 %
DL/VA: 3.91 ml/min/mmHg/L
DLCO cor % pred: 69 %
DLCO cor: 16.95 ml/min/mmHg
DLCO unc % pred: 69 %
DLCO unc: 16.95 ml/min/mmHg
FEF 25-75 Post: 0.59 L/sec
FEF 25-75 Pre: 0.69 L/sec
FEF2575-%Change-Post: -15 %
FEF2575-%Pred-Post: 29 %
FEF2575-%Pred-Pre: 34 %
FEV1-%Change-Post: 2 %
FEV1-%Pred-Post: 49 %
FEV1-%Pred-Pre: 48 %
FEV1-Post: 1.42 L
FEV1-Pre: 1.39 L
FEV1FVC-%Change-Post: 9 %
FEV1FVC-%Pred-Pre: 79 %
FEV6-%Change-Post: -3 %
FEV6-%Pred-Post: 59 %
FEV6-%Pred-Pre: 62 %
FEV6-Post: 2.25 L
FEV6-Pre: 2.34 L
FEV6FVC-%Change-Post: 0 %
FEV6FVC-%Pred-Post: 105 %
FEV6FVC-%Pred-Pre: 104 %
FVC-%Change-Post: -5 %
FVC-%Pred-Post: 56 %
FVC-%Pred-Pre: 60 %
FVC-Post: 2.28 L
FVC-Pre: 2.43 L
Post FEV1/FVC ratio: 62 %
Post FEV6/FVC ratio: 99 %
Pre FEV1/FVC ratio: 57 %
Pre FEV6/FVC Ratio: 98 %
RV % pred: 152 %
RV: 4.06 L
TLC % pred: 92 %
TLC: 6.5 L

## 2019-11-25 MED ORDER — STIOLTO RESPIMAT 2.5-2.5 MCG/ACT IN AERS
2.0000 | INHALATION_SPRAY | Freq: Every day | RESPIRATORY_TRACT | 11 refills | Status: DC
Start: 2019-11-25 — End: 2019-12-09

## 2019-11-25 MED ORDER — STIOLTO RESPIMAT 2.5-2.5 MCG/ACT IN AERS
2.0000 | INHALATION_SPRAY | Freq: Every day | RESPIRATORY_TRACT | 0 refills | Status: DC
Start: 2019-11-25 — End: 2021-05-23

## 2019-11-25 NOTE — Assessment & Plan Note (Signed)
Now improved would continue on Stiolto We will try to send through the [place] for coverage as he has no prescription coverage  Plan  Patient Instructions  Continue on Stiolto 2 puffs daily (will send to [place] )  Activity as tolerated.  Flutter valve Twice daily  .  Continue on Hypertonic nebs daily  Continue on GERD diet. No eating 2-3 hrs prior to bed.  Continue on Prilosec daily  Sputum AFB culture .  Chest xray today  Follow up with Dr. Elsworth Soho  In 4 weeks and As needed   Please contact office for sooner follow up if symptoms do not improve or worsen or seek emergency care

## 2019-11-25 NOTE — Assessment & Plan Note (Signed)
Recurrent pneumonia-with sputum culture positive for Pseudomonas/Pseudomonas pneumonia Patient is clinically improved after extended antibiotics with a 14-day course of Levaquin and a steroid taper. Recent CT did show some underlying bronchiectasis.  Patient is encouraged on pulmonary hygiene with flutter valve and hypertonic neb. He does have underlying moderate to severe COPD however PFTs were done during acute illness would consider repeating once back to baseline.  For now would continue on Stiolto Also patient had tree-in-bud opacities on CT questionable possible underlying MAI.  We will check sputum AFB.  Plan  Patient Instructions  Continue on Stiolto 2 puffs daily (will send to [facility] )  Activity as tolerated.  Flutter valve Twice daily  .  Continue on Hypertonic nebs daily  Continue on GERD diet. No eating 2-3 hrs prior to bed.  Continue on Prilosec daily  Sputum AFB culture .  Chest xray today  Follow up with Dr. Elsworth Soho  In 4 weeks and As needed   Please contact office for sooner follow up if symptoms do not improve or worsen or seek emergency care

## 2019-11-25 NOTE — Patient Instructions (Addendum)
Continue on Stiolto 2 puffs daily (will send to [place] )  Activity as tolerated.  Flutter valve Twice daily  .  Continue on Hypertonic nebs daily  Continue on GERD diet. No eating 2-3 hrs prior to bed.  Continue on Prilosec daily  Sputum AFB culture .  Chest xray today  Follow up with Dr. Elsworth Soho  In 4 weeks and As needed   Please contact office for sooner follow up if symptoms do not improve or worsen or seek emergency care

## 2019-11-25 NOTE — Progress Notes (Signed)
@Patient  ID: Nathaniel Leach, male    DOB: 1941-01-30, 79 y.o.   MRN: [id number]  Chief Complaint  Patient presents with  . Follow-up    Bronchiectasis     Referring provider: Angelina Sheriff, MD  HPI: 79 year old male former smoker seen for pulmonary consult July 2021 for post hospital follow-up for pneumonia.  Admitted to [facility] June 2021 with severe pneumonia and hypoxemia.  He was treated with antibiotics, discharged on Augmentin Medical history significant for A. fib  TEST/EVENTS :  HRCT 09/2019 > Mild centrilobular and paraseptal emphysema with prominent diffuse bronchial wall thickening, suggesting COPD. 2. Mild diffuse cylindrical bronchiectasis with moderate patchy tree-in-bud opacities most prominent in the dependent lower lobes. Findings suggest chronic/recurrent bronchiolitis such as due to recurrent aspiration or atypical mycobacterial infection (MAI). 3. Resolved bilateral lower lobe pneumonia seen on 08/26/2019    PFTs 10/2019 moderate airway obstruction, ratio 57, FEV1 1.39/48%, FVC 60%, no bronchodilator response, and DLCO 69%, TLC normal  CT angiogram chest 08/26/2019 patchy predominantly lower lobe opacities bilateral  CT chest with contrast 05/2019 moderate hiatal hernia small clustered nodules left lower lobe, mild scarring left upper lobe and lingula, mild bronchiectasis to my review  CT chest/abdomen/pelvis 04/2013 clear lungs   11/25/2019 Follow up ; bronchiectasis and pneumonia Patient returns for a 2-week follow-up.  Patient was seen in July after a hospital stay at [facility] for pneumonia.  Patient says he has had pneumonia several times in the past.  Most recently in June 2021.  He did require hospitalization was treated with IV antibiotics and discharged on Augmentin.  He continued to have ongoing symptoms of cough and congestion.  CT chest June showed patchy lower lobe opacities bilaterally.  Patient had some clinical improvement.   However continued to have shortness of breath and ongoing cough.  He does have a known hiatal hernia.  No previous stroke.  No difficulty swallowing.  He does have some GERD which he says is well controlled on Prilosec daily. Has had some chronic rhinitis and previously seen ENT in the past.  Michela Pitcher he had a long sinus infection earlier this year that required antibiotics. Last visit he was noted to have increased opacities bilaterally on his chest x-ray was treated for suspected pneumonia with extended antibiotics Levaquin for 14 days.  Along with a prednisone taper.  Pulmonary function testing showed moderate to severe airflow obstruction with an FEV1 at 48% ratio 57.  High-resolution CT chest October 14, 2019 showed mild emphysema no acute consolidation mild bronchiectasis and moderate patchy tree-in-bud opacities throughout both lungs.  Resolution of previous consolidation noted. Patient is on hypertonic nebs daily.  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  Patient does not have prescription coverage and gets medications through the [place].  He was started on Stiolto last visit he is unsure if he can see any significant benefit.   Patient has finished his antibiotics and steroids says that he has much improved with decreased cough congestion.  Says he has minimally productive cough now which is much improved Sputum culture was done and showed Pseudomonas.  This was pansensitive. Sputum AFB was ordered but not completed.  Allergies  Allergen Reactions  . Sertraline Other (See Comments)    Jerking and unable to speak well    Immunization History  Administered Date(s) Administered  . Influenza-Unspecified 10/27/2017  . PFIZER SARS-COV-2 Vaccination 03/25/2019, 04/16/2019    Past Medical History:  Diagnosis  Date  . Acute recurrent maxillary sinusitis 03/30/2019  . Anosmia 02/18/2019  . APC (atrial premature contractions) 08/28/2016  . Arrhythmia   . CHF (congestive heart  failure) ([place])   . Chronic anticoagulation 08/31/2014  . Chronic diastolic heart failure ([place]) 08/31/2014  . Chronic ethmoidal sinusitis 04/28/2019  . Chronic kidney disease   . CKD (chronic kidney disease) stage 3, GFR 30-59 ml/min (HCC) 11/23/2016  . Coronary artery disease involving native coronary artery of native heart 08/31/2014  . Dyslipidemia 08/28/2016  . Hyperlipidemia   . Hypertension   . Hypertensive heart and chronic kidney disease with heart failure and stage 1 through stage 4 chronic kidney disease, or unspecified chronic kidney disease ([place]) 08/31/2014  . Microalbuminuria 05/02/2017  . Nasal septal deviation 02/18/2019  . PAF (paroxysmal atrial fibrillation) ([place]) 08/31/2014   Overview:  rapid rate requiring beta blocker , CCB and amiodorone for rate control  . 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 Packs/day: 1.50  . Years: 20.00  . Pack years: 30.00  . Types: Cigarettes  Smokeless Tobacco Former Systems developer  . Types: Chew  Tobacco Comment   Quit in 1975   Counseling given: Not Answered Comment: Quit in 1975   Outpatient Medications Prior to Visit  Medication Sig Dispense Refill  . Alogliptin Benzoate 25 MG TABS Take 12.5 mg by mouth daily.    Marland Kitchen amoxicillin (AMOXIL) 500 MG tablet Take 500 mg by mouth 2 (two) times daily. Takes for 3 more days    . apixaban (ELIQUIS) 5 MG TABS tablet Take 5 mg by mouth 2 (two) times daily.    Marland Kitchen atorvastatin (LIPITOR) 80 MG tablet Take 80 mg by mouth daily.     . citalopram (CELEXA) 20 MG tablet Take 10 mg by mouth daily.    . digoxin (LANOXIN) 0.125 MG tablet Take 1 tablet (0.125 mg total) by mouth daily. 90 tablet 3  . glipiZIDE (GLUCOTROL) 10 MG tablet Take 10 mg by mouth 2 (two) times daily before a meal.     . levofloxacin (LEVAQUIN) 500 MG tablet Take 1 tablet (500 mg total) by mouth daily. 7 tablet  0  . levofloxacin (LEVAQUIN) 500 MG tablet Take 1 tablet (500 mg total) by mouth daily. 7 tablet 0  . levofloxacin (LEVAQUIN) 500 MG tablet Take 1 tablet (500 mg total) by mouth daily. 7 tablet 0  . levofloxacin (LEVAQUIN) 500 MG tablet Take 1 tablet (500 mg total) by mouth daily. 14 tablet 0  . losartan (COZAAR) 25 MG tablet Take 25 mg by mouth in the morning and at bedtime. Takes 1/2 tab in Am and 1/2 tab in PM    . metoprolol tartrate (LOPRESSOR) 25 MG tablet Take 25 mg by mouth 2 (two) times daily.    . Multiple Vitamin (MULTIVITAMIN) capsule Take 1 capsule by mouth daily.     . Omega-3 Fatty Acids (FISH OIL PO) Take by mouth.    Marland Kitchen omeprazole (PRILOSEC) 20 MG capsule Take 20 mg by mouth daily.    . predniSONE (DELTASONE) 10 MG tablet Take 1 tablet (10 mg total) by mouth daily with breakfast. 20 tablet 0  . predniSONE (DELTASONE) 10 MG tablet 4 x 3 days, 3 x 4 days, 2 x 4 days, 1 x 4 days then stop 40 tablet 0  . sodium chloride HYPERTONIC 3 %  nebulizer solution Take by nebulization daily. 120 mL 2  . Tiotropium Bromide-Olodaterol (STIOLTO RESPIMAT) 2.5-2.5 MCG/ACT AERS Inhale 2 puffs into the lungs daily. 4 g 0  . vitamin E 400 UNIT capsule Take 400 Units by mouth daily.    . metolazone (ZAROXOLYN) 2.5 MG tablet Take 1 tablet (2.5 mg total) by mouth daily. (Patient not taking: Reported on 09/02/2019) 30 tablet 3   No facility-administered medications prior to visit.     Review of Systems:   Constitutional:   No  weight loss, night sweats,  Fevers, chills,  +fatigue, or  lassitude.  HEENT:   No headaches,  Difficulty swallowing,  Tooth/dental problems, or  Sore throat,                No sneezing, itching, ear ache, nasal congestion, post nasal drip,   CV:  No chest pain,  Orthopnea, PND, swelling in lower extremities, anasarca, dizziness, palpitations, syncope.   GI  No heartburn, indigestion, abdominal pain, nausea, vomiting, diarrhea, change in bowel habits, loss of appetite, bloody  stools.   Resp:  .  No chest wall deformity  Skin: no rash or lesions.  GU: no dysuria, change in color of urine, no urgency or frequency.  No flank pain, no hematuria   MS:  No joint pain or swelling.  No decreased range of motion.  No back pain.    Physical Exam  BP 130/66 (BP Location: Left Arm, Cuff Size: Normal)   Pulse (!) 117   Temp (!) 97.5 F (36.4 C) (Temporal)   Ht 5\' 11"  (1.803 m)   Wt 201 lb (91.2 kg)   SpO2 95% Comment: RA  BMI 28.03 kg/m   GEN: A/Ox3; pleasant , NAD, well nourished    HEENT:  [place]/AT,    NOSE-clear, THROAT-clear, no lesions, no postnasal drip or exudate noted.   NECK:  Supple w/ fair ROM; no JVD; normal carotid impulses w/o bruits; no thyromegaly or nodules palpated; no lymphadenopathy.    RESP  Clear  P & A; w/o, wheezes/ rales/ or rhonchi. no accessory muscle use, no dullness to percussion  CARD:  RRR, no m/r/g, no peripheral edema, pulses intact, no cyanosis or clubbing.  GI:   Soft & nt; nml bowel sounds; no organomegaly or masses detected.   Musco: Warm bil, no deformities or joint swelling noted.   Neuro: alert, no focal deficits noted.    Skin: Warm, no lesions or rashes    Lab Results:  CBC  BNP  Imaging: DG Chest 2 View  Result Date: 11/12/2019 CLINICAL DATA:  Cough EXAM: CHEST - 2 VIEW COMPARISON:  August 26, 2019, October 14, 2019 FINDINGS: The cardiomediastinal silhouette is unchanged in contour.Status post median sternotomy and CABG. Atherosclerotic calcifications of the aorta. Pulmonary hyperinflation. No pleural effusion. No pneumothorax. Increased bilateral heterogeneous opacities with a peripheral predominance. This is superimposed on a background of coarse interstitial opacities. Peribronchial cuffing. Visualized abdomen is unremarkable. Mild degenerative changes of the thoracic spine. IMPRESSION: 1. Increased bilateral heterogeneous opacities with a peripheral predominance. This is suspicious for multifocal infection,  including atypical infection. Recommend follow-up PA and lateral chest radiograph in 3 months to assess for resolution. 2. Pulmonary hyperinflation with chronic interstitial changes, likely related to COPD. Electronically Signed   By: Valentino Saxon MD   On: 11/12/2019 15:21      PFT Results Latest Ref Rng & Units 11/11/2019  FVC-Pre L 2.43  FVC-Predicted Pre % 60  FVC-Post L 2.28  FVC-Predicted  Post % 56  Pre FEV1/FVC % % 57  Post FEV1/FCV % % 62  FEV1-Pre L 1.39  FEV1-Predicted Pre % 48  FEV1-Post L 1.42  DLCO uncorrected ml/min/mmHg 16.95  DLCO UNC% % 69  DLCO corrected ml/min/mmHg 16.95  DLCO COR %Predicted % 69  DLVA Predicted % 100  TLC L 6.50  TLC % Predicted % 92  RV % Predicted % 152    No results found for: NITRICOXIDE      Assessment & Plan:   Community acquired pneumonia Recurrent pneumonia-with sputum culture positive for Pseudomonas/Pseudomonas pneumonia Patient is clinically improved after extended antibiotics with a 14-day course of Levaquin and a steroid taper. Recent CT did show some underlying bronchiectasis.  Patient is encouraged on pulmonary hygiene with flutter valve and hypertonic neb. He does have underlying moderate to severe COPD however PFTs were done during acute illness would consider repeating once back to baseline.  For now would continue on Stiolto Also patient had tree-in-bud opacities on CT questionable possible underlying MAI.  We will check sputum AFB.  Plan  Patient Instructions  Continue on Stiolto 2 puffs daily (will send to [facility] )  Activity as tolerated.  Flutter valve Twice daily  .  Continue on Hypertonic nebs daily  Continue on GERD diet. No eating 2-3 hrs prior to bed.  Continue on Prilosec daily  Sputum AFB culture .  Chest xray today  Follow up with Dr. Elsworth Soho  In 4 weeks and As needed   Please contact office for sooner follow up if symptoms do not improve or worsen or seek emergency care       Bronchiectasis with  acute exacerbation [facility]) Recent flare now resolved Continue with aggressive pulmonary hygiene regimen  Plan  Patient Instructions  Continue on Stiolto 2 puffs daily (will send to [facility] )  Activity as tolerated.  Flutter valve Twice daily  .  Continue on Hypertonic nebs daily  Continue on GERD diet. No eating 2-3 hrs prior to bed.  Continue on Prilosec daily  Sputum AFB culture .  Chest xray today  Follow up with Dr. Elsworth Soho  In 4 weeks and As needed   Please contact office for sooner follow up if symptoms do not improve or worsen or seek emergency care       COPD with acute lower respiratory infection ([place]) Now improved would continue on Stiolto We will try to send through the [place] for coverage as he has no prescription coverage  Plan  Patient Instructions  Continue on Stiolto 2 puffs daily (will send to [place] )  Activity as tolerated.  Flutter valve Twice daily  .  Continue on Hypertonic nebs daily  Continue on GERD diet. No eating 2-3 hrs prior to bed.  Continue on Prilosec daily  Sputum AFB culture .  Chest xray today  Follow up with Dr. Elsworth Soho  In 4 weeks and As needed   Please contact office for sooner follow up if symptoms do not improve or worsen or seek emergency care          Rexene Edison, NP 11/25/2019

## 2019-11-25 NOTE — Assessment & Plan Note (Signed)
Recent flare now resolved Continue with aggressive pulmonary hygiene regimen  Plan  Patient Instructions  Continue on Stiolto 2 puffs daily (will send to [place] )  Activity as tolerated.  Flutter valve Twice daily  .  Continue on Hypertonic nebs daily  Continue on GERD diet. No eating 2-3 hrs prior to bed.  Continue on Prilosec daily  Sputum AFB culture .  Chest xray today  Follow up with Dr. Elsworth Soho  In 4 weeks and As needed   Please contact office for sooner follow up if symptoms do not improve or worsen or seek emergency care

## 2019-11-30 ENCOUNTER — Telehealth: Payer: Self-pay | Admitting: Adult Health

## 2019-11-30 NOTE — Telephone Encounter (Signed)
Reviewed chest x-ray results with patient. Patient stated understanding. Nothing further needed at this time.

## 2019-12-04 NOTE — Progress Notes (Signed)
Called and spoke with patient, provided results to patient per Rexene Edison NP. Patient states he feels the best he has felt since he was in the office.  He verbalized understanding.  Nothing further needed.

## 2019-12-07 DIAGNOSIS — I509 Heart failure, unspecified: Secondary | ICD-10-CM | POA: Insufficient documentation

## 2019-12-07 DIAGNOSIS — I499 Cardiac arrhythmia, unspecified: Secondary | ICD-10-CM | POA: Insufficient documentation

## 2019-12-07 DIAGNOSIS — N189 Chronic kidney disease, unspecified: Secondary | ICD-10-CM | POA: Insufficient documentation

## 2019-12-07 DIAGNOSIS — I10 Essential (primary) hypertension: Secondary | ICD-10-CM | POA: Insufficient documentation

## 2019-12-08 NOTE — Progress Notes (Signed)
Cardiology Office Note:    Date:  12/09/2019   ID:  Nathaniel Leach, DOB [birth year], MRN [id number]  PCP:  Angelina Sheriff, MD  Cardiologist:  Shirlee More, MD    Referring MD: Angelina Sheriff, MD    ASSESSMENT:    1. Coronary artery disease involving native coronary artery of native heart without angina pectoris   2. Hypertensive heart and chronic kidney disease with heart failure and stage 1 through stage 4 chronic kidney disease, or unspecified chronic kidney disease ([place])   3. PAF (paroxysmal atrial fibrillation) ([place])   4. High risk medication use   5. Chronic anticoagulation   6. Dyslipidemia   7. Centrilobular emphysema ([place])    PLAN:    In order of problems listed above:  1. In general from a cardiology perspective he does well stable CAD New York Heart Association class I having no anginal discomfort at this time I would not advise an ischemia evaluation continue medical therapy 2. Stable blood pressure at target currently not on a loop diuretic recent echocardiogram with normal ejection fraction 3. He is back in atrial fibrillation persistent I would not advise cardioversion continue low-dose digoxin increase beta-blocker.  He will continue his current anticoagulant 4. Lipids are ideal continue with statin 5. Improved with pulmonary perspective I told him he could start a walking program either outdoors for treadmill at home at a low speed 2  mph flat goal 10 to 20 minutes   Next appointment: 3 months   Medication Adjustments/Labs and Tests Ordered: Current medicines are reviewed at length with the patient today.  Concerns regarding medicines are outlined above.  Orders Placed This Encounter  Procedures  . EKG 12-Lead   Meds ordered this encounter  Medications  . metoprolol tartrate (LOPRESSOR) 25 MG tablet    Sig: Take 1 tablet (25 mg total) by mouth 3 (three) times daily. 0.5 tablet twice a day    Dispense:  90 tablet    Refill:  3    Chief  Complaint  Patient presents with  . Follow-up  . Atrial Fibrillation  . Anticoagulation  . Congestive Heart Failure  . Hypertension  . Coronary Artery Disease    History of Present Illness:    Nathaniel Leach is a 79 y.o. male with a hx of paroxysmal atrial fibrillation suppressed with amiodarone, CAD with bypass surgery in 2004, hypertensive heart disease with diastolic heart failure and CKD and dyslipidemia.  Last seen 04/01/2019.  He has been seen by pulmonary for emphysema and moderate to severe COPD bronchiectasis and bilateral lower lobe pneumonia in June 2021 with hypoxic respiratory failure. Compliance with diet, lifestyle and medications: Yes  Chest x-ray 10/30/2019 shows stable moderate severity diffuse interstitial infiltrates chronic in nature.  His high-resolution CT of the chest 10/14/2019 showed mild emphysema diffuse bronchial wall thickening diffuse bronchiectasis and resolved bilateral lower lobe pneumonia he also had a bout injury appearance raising concern for atypical mycobacterial infection.  He underwent a myocardial perfusion study 07/09/2019 showing EF 57% normal left ventricular function and no evidence of ischemia.  Echocardiogram 07/03/2019 showed a low normal ejection fraction 50 to 44% normal diastolic filling pressure normal right ventricular function and mild enlargement of the ascending aorta.  He is admitted to [facility] in July 20 4 hours 08/27/2019 to 08/28/2019 with pneumonia treated with antibiotics.  His course was complicated by hyponatremia managed by the hospitalist service metolazone was discontinued serum sodium at discharge was 126 creatinine 1.30 GFR  53 cc potassium 3.5 hemoglobin 12.5.  He had another CT scan of the chest performed showing lower lobe infiltrates felt to be residual of prior Covid infection his EKG reviewed from the hospital showed sinus rhythm first-degree AV block otherwise normal.     .  He is finally starting to feel  better is gone start walking on his treadmill again is not having purulent sputum wheezing or shortness of breath.  From his description has been in atrial fibrillation for about a month he was seen at [facility] and EKG done there except for last night not really aware of it.  He talked about cardioversion but I think that in his case it is unlikely to maintain sinus rhythm it was even difficult in the past with high-dose amiodarone he is relatively asymptomatic and I think at this point in time I would concentrate on rate control will increase the dose of his beta-blocker.  His quality of life is good he is not having edema orthopnea chest pain or syncope.  He is anticoagulated without bleeding complication.  09/03/2019: Cholesterol target 122 LDL 54 triglycerides 160 HDL 36 A1c mildly elevated 7.2% creatinine mildly increased 1.40  EKG today atrial fibrillation rapid rate 107 bpm Past Medical History:  Diagnosis Date  . Acute recurrent maxillary sinusitis 03/30/2019  . Anosmia 02/18/2019  . APC (atrial premature contractions) 08/28/2016  . Arrhythmia   . CHF (congestive heart failure) ([place])   . Chronic anticoagulation 08/31/2014  . Chronic diastolic heart failure ([place]) 08/31/2014  . Chronic ethmoidal sinusitis 04/28/2019  . Chronic kidney disease   . CKD (chronic kidney disease) stage 3, GFR 30-59 ml/min (H[facility]) 11/23/2016  . Coronary artery disease involving native coronary artery of native heart 08/31/2014  . Dyslipidemia 08/28/2016  . Hyperlipidemia   . Hypertension   . Hypertensive heart and chronic kidney disease with heart failure and stage 1 through stage 4 chronic kidney disease, or unspecified chronic kidney disease ([place]) 08/31/2014  . Microalbuminuria 05/02/2017  . Nasal septal deviation 02/18/2019  . On amiodarone therapy 08/31/2014  . PAF (paroxysmal atrial fibrillation) ([place]) 08/31/2014   Overview:  rapid rate requiring beta blocker , CCB and amiodorone for rate control  . 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 APPENDECTOMY    . CATARACT EXTRACTION    . CHOLECYSTECTOMY    . CORONARY ARTERY BYPASS GRAFT  2004  . MOUTH SURGERY    . RHINOPLASTY      Current Medications: Current Meds  Medication Sig  . Alogliptin Benzoate 25 MG TABS Take 12.5 mg by mouth daily.  Marland Kitchen apixaban (ELIQUIS) 5 MG TABS tablet Take 5 mg by mouth 2 (two) times daily.  Marland Kitchen atorvastatin (LIPITOR) 80 MG tablet Take 80 mg by mouth daily.   . citalopram (CELEXA) 20 MG tablet Take 10 mg by mouth daily.  . digoxin (LANOXIN) 0.125 MG tablet Take 1 tablet (0.125 mg total) by mouth daily.  Marland Kitchen glipiZIDE (GLUCOTROL) 5 MG tablet Take 5 mg by mouth. Take 1 tablet (5 mg) am and 0.5 tablet (2.5 mg) pm  . losartan (COZAAR) 25 MG tablet Take 25 mg by mouth in the morning and at bedtime. Takes 1/2 tab in Am and 1/2 tab in PM  . metoprolol tartrate (LOPRESSOR) 25 MG tablet Take 1 tablet (25 mg total) by mouth 3 (three) times daily. 0.5  tablet twice a day  . Multiple Vitamin (MULTIVITAMIN) capsule Take 1 capsule by mouth daily.   . Omega-3 Fatty Acids (FISH OIL PO) Take by mouth.  Marland Kitchen omeprazole (PRILOSEC) 20 MG capsule Take 20 mg by mouth daily.  . sodium chloride HYPERTONIC 3 % nebulizer solution Take by nebulization daily.  . Tiotropium Bromide-Olodaterol (STIOLTO RESPIMAT) 2.5-2.5 MCG/ACT AERS Inhale 2 puffs into the lungs daily.  . vitamin E 400 UNIT capsule Take 400 Units by mouth daily.  . [DISCONTINUED] metoprolol tartrate (LOPRESSOR) 25 MG tablet Take 25 mg by mouth. 0.5 tablet twice a day     Allergies:   Sertraline   Social History   Socioeconomic History  . Marital status: Married    Spouse name: Not on file  . Number of children: Not on file  . Years of education: Not on file  . Highest education level: Not on file  Occupational History  . Not on file  Tobacco Use  . Smoking status:  Former Smoker    Packs/day: 1.50    Years: 20.00    Pack years: 30.00    Types: Cigarettes  . Smokeless tobacco: Former Systems developer    Types: Chew  . Tobacco comment: Quit in 1975  Vaping Use  . Vaping Use: Never used  Substance and Sexual Activity  . Alcohol use: Not Currently  . Drug use: Never  . Sexual activity: Not on file  Other Topics Concern  . Not on file  Social History Narrative  . Not on file   Social Determinants of Health   Financial Resource Strain:   . Difficulty of Paying Living Expenses: Not on file  Food Insecurity:   . Worried About Charity fundraiser in the Last Year: Not on file  . Ran Out of Food in the Last Year: Not on file  Transportation Needs:   . Lack of Transportation (Medical): Not on file  . Lack of Transportation (Non-Medical): Not on file  Physical Activity:   . Days of Exercise per Week: Not on file  . Minutes of Exercise per Session: Not on file  Stress:   . Feeling of Stress : Not on file  Social Connections:   . Frequency of Communication with Friends and Family: Not on file  . Frequency of Social Gatherings with Friends and Family: Not on file  . Attends Religious Services: Not on file  . Active Member of Clubs or Organizations: Not on file  . Attends Archivist Meetings: Not on file  . Marital Status: Not on file     Family History: The patient's family history includes CAD in his father; Diabetes in his brother; Stroke in his mother. ROS:   Please see the history of present illness.    All other systems reviewed and are negative.  EKGs/Labs/Other Studies Reviewed:    The following studies were reviewed today:    Recent Labs: 07/02/2019: ALT 23; BNP 35.8; Hemoglobin 13.7; Platelets 249; TSH 2.520 08/04/2019: BUN 10; Creatinine, Ser 1.05; Potassium 3.9; Sodium 132  Recent Lipid Panel    Component Value Date/Time   CHOL 128 10/08/2016 1110   TRIG 258 (H) 10/08/2016 1110   HDL 36 (L) 10/08/2016 1110   CHOLHDL 3.6  10/08/2016 1110   LDLCALC 40 10/08/2016 1110    Physical Exam:    VS:  BP 115/65   Pulse (!) 107   Ht 5\' 11"  (1.803 m)   Wt 203 lb (92.1 kg)   SpO2 96%  BMI 28.31 kg/m     Wt Readings from Last 3 Encounters:  12/09/19 203 lb (92.1 kg)  11/25/19 201 lb (91.2 kg)  11/11/19 200 lb (90.7 kg)     GEN: He does not look chronically ill or debilitated well nourished, well developed in no acute distress HEENT: Normal NECK: No JVD; No carotid bruits LYMPHATICS: No lymphadenopathy CARDIAC: Irregular S1 variable  no murmurs, rubs, gallops RESPIRATORY:  Clear to auscultation without rales, wheezing or rhonchi  ABDOMEN: Soft, non-tender, non-distended MUSCULOSKELETAL:  No edema; No deformity  SKIN: Warm and dry NEUROLOGIC:  Alert and oriented x 3 PSYCHIATRIC:  Normal affect    Signed, Shirlee More, MD  12/09/2019 2:52 PM    [place]

## 2019-12-09 ENCOUNTER — Encounter: Payer: Self-pay | Admitting: Cardiology

## 2019-12-09 ENCOUNTER — Other Ambulatory Visit: Payer: Self-pay

## 2019-12-09 ENCOUNTER — Ambulatory Visit (INDEPENDENT_AMBULATORY_CARE_PROVIDER_SITE_OTHER): Payer: Medicare Other | Admitting: Cardiology

## 2019-12-09 VITALS — BP 115/65 | HR 107 | Ht 71.0 in | Wt 203.0 lb

## 2019-12-09 DIAGNOSIS — I2510 Atherosclerotic heart disease of native coronary artery without angina pectoris: Secondary | ICD-10-CM | POA: Diagnosis not present

## 2019-12-09 DIAGNOSIS — Z79899 Other long term (current) drug therapy: Secondary | ICD-10-CM

## 2019-12-09 DIAGNOSIS — I480 Paroxysmal atrial fibrillation: Secondary | ICD-10-CM

## 2019-12-09 DIAGNOSIS — I130 Hypertensive heart and chronic kidney disease with heart failure and stage 1 through stage 4 chronic kidney disease, or unspecified chronic kidney disease: Secondary | ICD-10-CM

## 2019-12-09 DIAGNOSIS — J432 Centrilobular emphysema: Secondary | ICD-10-CM

## 2019-12-09 DIAGNOSIS — Z7901 Long term (current) use of anticoagulants: Secondary | ICD-10-CM

## 2019-12-09 DIAGNOSIS — E785 Hyperlipidemia, unspecified: Secondary | ICD-10-CM

## 2019-12-09 MED ORDER — METOPROLOL TARTRATE 25 MG PO TABS: 25.0000 mg | ORAL_TABLET | Freq: Three times a day (TID) | ORAL | 3 refills | Status: DC

## 2019-12-09 NOTE — Patient Instructions (Signed)
Medication Instructions:  Your physician has recommended you make the following change in your medication:  START: Lopressor 25 mg take one tablet by mouth three times daily.  *If you need a refill on your cardiac medications before your next appointment, please call your pharmacy*   Lab Work: None If you have labs (blood work) drawn today and your tests are completely normal, you will receive your results only by:  [place] (if you have MyChart) OR  A paper copy in the mail If you have any lab test that is abnormal or we need to change your treatment, we will call you to review the results.   Testing/Procedures: None   Follow-Up: At [facility], you and your health needs are our priority.  As part of our continuing mission to provide you with exceptional heart care, we have created designated Provider Care Teams.  These Care Teams include your primary Cardiologist (physician) and Advanced Practice Providers (APPs -  Physician Assistants and Nurse Practitioners) who all work together to provide you with the care you need, when you need it.  We recommend signing up for the patient portal called "MyChart".  Sign up information is provided on this After Visit Summary.  MyChart is used to connect with patients for Virtual Visits (Telemedicine).  Patients are able to view lab/test results, encounter notes, upcoming appointments, etc.  Non-urgent messages can be sent to your provider as well.   To learn more about what you can do with MyChart, go to [email or web address].    Your next appointment:   3 month(s)  The format for your next appointment:   In Person  Provider:   Shirlee More, MD   Other Instructions

## 2019-12-24 DIAGNOSIS — L02414 Cutaneous abscess of left upper limb: Secondary | ICD-10-CM | POA: Diagnosis not present

## 2019-12-24 DIAGNOSIS — L0390 Cellulitis, unspecified: Secondary | ICD-10-CM | POA: Diagnosis not present

## 2019-12-29 LAB — AFB CULTURE WITH SMEAR (NOT AT ARMC)
Acid Fast Culture: NEGATIVE
Acid Fast Smear: NEGATIVE

## 2019-12-30 ENCOUNTER — Ambulatory Visit (INDEPENDENT_AMBULATORY_CARE_PROVIDER_SITE_OTHER): Payer: Medicare Other | Admitting: Pulmonary Disease

## 2019-12-30 ENCOUNTER — Telehealth: Payer: Self-pay | Admitting: Pulmonary Disease

## 2019-12-30 ENCOUNTER — Ambulatory Visit (INDEPENDENT_AMBULATORY_CARE_PROVIDER_SITE_OTHER): Payer: Medicare Other

## 2019-12-30 ENCOUNTER — Encounter: Payer: Self-pay | Admitting: Pulmonary Disease

## 2019-12-30 ENCOUNTER — Other Ambulatory Visit: Payer: Self-pay

## 2019-12-30 VITALS — BP 116/62 | HR 108 | Temp 97.8°F | Ht 71.0 in | Wt 206.2 lb

## 2019-12-30 DIAGNOSIS — J479 Bronchiectasis, uncomplicated: Secondary | ICD-10-CM

## 2019-12-30 DIAGNOSIS — J471 Bronchiectasis with (acute) exacerbation: Secondary | ICD-10-CM | POA: Diagnosis not present

## 2019-12-30 DIAGNOSIS — I2510 Atherosclerotic heart disease of native coronary artery without angina pectoris: Secondary | ICD-10-CM

## 2019-12-30 DIAGNOSIS — J440 Chronic obstructive pulmonary disease with acute lower respiratory infection: Secondary | ICD-10-CM | POA: Diagnosis not present

## 2019-12-30 NOTE — Patient Instructions (Addendum)
CXR today Spirometry pre & post Stay on stiolto  Sputum sent today OK to decrease saline nebs once daily

## 2019-12-30 NOTE — Telephone Encounter (Signed)
Called and went over spirometry & xray results per Dr Elsworth Soho with patient. All questions answered and patient expressed full understanding of results and Dr Bari Mantis recommendation that patient can stop taking stiolto after he is done with current inhaler. Nothing further needed at this time.

## 2019-12-30 NOTE — Addendum Note (Signed)
Addended by: Luanna Salk on: 12/30/2019 11:48 AM   Modules accepted: Orders

## 2019-12-30 NOTE — Assessment & Plan Note (Addendum)
Continue Stiolto. Repeat spirometry since early airway obstruction could have been related to bronchiectatic exacerbation.   I am concerned about bronchodilators worsening his atrial fibrillation  Addendum -spirometry obtained today showed no airway obstruction and FEV1 of 85%.  He can stop taking the [employer]

## 2019-12-30 NOTE — Telephone Encounter (Signed)
spirometry obtained today showed no airway obstruction and FEV1 of 85%.  He can stop taking the Stiolto after he is done with his current inhaler  Chest x-ray shows improvement compared to prior

## 2019-12-30 NOTE — Addendum Note (Signed)
Addended by: Satira Sark D on: 12/30/2019 11:57 AM   Modules accepted: Orders

## 2019-12-30 NOTE — Progress Notes (Signed)
   Subjective:    Patient ID: Nathaniel Leach, male    DOB: [birth year], 79 y.o.   MRN: [id number]  HPI   79 year old remote smoker for follow-up of bronchiectasis and Pseudomonas pneumonia. He presented with dyspnea on exertion for 6 months and hypoxia -Moderate hiatal hernia -Episode of bilateral lower lobe pneumonia 07/2019 -new onset A. fib  on anticoagulation  08/2019 sputum culture positive for Pseudomonas sensitive to Cipro/levofloxacin  PFT showed airway obstruction.Marland Kitchen He was treated again 10/2019 with another course of Levaquin.  He finally feels much improved, 90% back to baseline.  Airway clearance measures seem to have worked  He has been able to get Stiolto from the [place] and this seems to have helped his breathing.  His atrial fibrillation however is out of control.  He brings in a sputum specimen today .  He has been compliant with hypertonic saline nebs  Significant tests/ events reviewed HRCT 09/2019 > Mild centrilobular and paraseptal emphysema with prominent diffuse bronchial wall thickening, suggesting COPD. 2. Mild diffuse cylindrical bronchiectasis with moderate patchy tree-in-bud opacities most prominent in the dependent lower lobes. Findings suggest chronic/recurrent bronchiolitis such as due to recurrent aspiration or atypical mycobacterial infection (MAI). 3. Resolved bilateral lower lobe pneumonia seen on 08/26/2019    PFTs 10/2019 moderate airway obstruction, ratio 57, FEV1 1.39/48%, FVC 60%, no bronchodilator response, and DLCO 69%, TLC normal  CT angiogram chest 08/26/2019 patchy predominantly lower lobe opacities bilateral  CT chest with contrast 05/2019 moderate hiatal hernia small clustered nodules left lower lobe, mild scarring left upper lobe and lingula, mild bronchiectasis to my review  CT chest/abdomen/pelvis 04/2013 clear lungs   Review of Systems neg for any significant sore throat, dysphagia, itching, sneezing, nasal congestion or excess/ purulent  secretions, fever, chills, sweats, unintended wt loss, pleuritic or exertional cp, hempoptysis, orthopnea pnd or change in chronic leg swelling. Also denies presyncope, palpitations, heartburn, abdominal pain, nausea, vomiting, diarrhea or change in bowel or urinary habits, dysuria,hematuria, rash, arthralgias, visual complaints, headache, numbness weakness or ataxia.     Objective:   Physical Exam  Gen. Pleasant, elderly,well-nourished, in no distress ENT - no thrush, no pallor/icterus,no post nasal drip Neck: No JVD, no thyromegaly, no carotid bruits Lungs: no use of accessory muscles, no dullness to percussion, bibasal rales no rhonchi  Cardiovascular: Rhythm regular, heart sounds  normal, no murmurs or gallops, no peripheral edema Musculoskeletal: No deformities, no cyanosis or clubbing        Assessment & Plan:

## 2019-12-30 NOTE — Assessment & Plan Note (Addendum)
Much improved after treatment for Pseudomonas pneumonia. Airway clearance measures seem to have worked. Emphasized use of hypertonic saline nebs when he has increased pain production or change in color of sputum  otherwise he decrease this to once daily and eventually stop using this  He brought in a sputum specimen today and we will again repeat sputum culture and AFB on this looking for MAC as cause of his bronchiectasis.  This could also be related to repeated aspiration from reflux

## 2020-01-01 LAB — RESPIRATORY CULTURE OR RESPIRATORY AND SPUTUM CULTURE: MICRO NUMBER:: 11154788

## 2020-01-25 ENCOUNTER — Telehealth: Payer: Self-pay | Admitting: Pulmonary Disease

## 2020-01-26 MED ORDER — SODIUM CHLORIDE 3 % IN NEBU: INHALATION_SOLUTION | Freq: Every day | RESPIRATORY_TRACT | 5 refills | Status: DC

## 2020-01-26 NOTE — Telephone Encounter (Signed)
Rx for saline neb solution was sent to pharm  I spoke with the pt and notified that this was done  Nothing further needed

## 2020-03-10 DIAGNOSIS — L02413 Cutaneous abscess of right upper limb: Secondary | ICD-10-CM | POA: Diagnosis not present

## 2020-03-10 DIAGNOSIS — C44729 Squamous cell carcinoma of skin of left lower limb, including hip: Secondary | ICD-10-CM | POA: Diagnosis not present

## 2020-03-10 DIAGNOSIS — L989 Disorder of the skin and subcutaneous tissue, unspecified: Secondary | ICD-10-CM | POA: Diagnosis not present

## 2020-03-15 ENCOUNTER — Ambulatory Visit: Payer: Medicare Other | Admitting: Cardiology

## 2020-03-31 ENCOUNTER — Other Ambulatory Visit: Payer: Self-pay

## 2020-03-31 ENCOUNTER — Encounter: Payer: Self-pay | Admitting: Adult Health

## 2020-03-31 ENCOUNTER — Ambulatory Visit (INDEPENDENT_AMBULATORY_CARE_PROVIDER_SITE_OTHER): Payer: Medicare Other | Admitting: Adult Health

## 2020-03-31 DIAGNOSIS — J479 Bronchiectasis, uncomplicated: Secondary | ICD-10-CM

## 2020-03-31 DIAGNOSIS — J439 Emphysema, unspecified: Secondary | ICD-10-CM

## 2020-03-31 DIAGNOSIS — R634 Abnormal weight loss: Secondary | ICD-10-CM | POA: Diagnosis not present

## 2020-03-31 DIAGNOSIS — R5381 Other malaise: Secondary | ICD-10-CM

## 2020-03-31 NOTE — Assessment & Plan Note (Signed)
Bronchiectasis-encourage patient on increased pulmonary hygiene/mucociliary clearance.  Have asked him to restart flutter valve twice daily.  Continue on hypertonic nebs daily. Patient does have increased shortness of breath since stopping Stiolto may restart this as he gets this medication through the [place].  Plan  Patient Instructions  Restart Stiolto 2 puffs daily (will send to [facility] )  Activity as tolerated.  Restart Flutter valve Twice daily  .  Continue on Hypertonic nebs daily  Follow up with Dr. Elsworth Soho  In 4 months  and As needed   Please contact office for sooner follow up if symptoms do not improve or worsen or seek emergency care    '

## 2020-03-31 NOTE — Assessment & Plan Note (Signed)
Patient is continue with activity as tolerated.

## 2020-03-31 NOTE — Progress Notes (Signed)
fo

## 2020-03-31 NOTE — Progress Notes (Signed)
@Patient  ID: Nathaniel Leach, male    DOB: [birth year], 80 y.o.   MRN: [id number]  Chief Complaint  Patient presents with  . Follow-up    Referring provider: Angelina Sheriff, MD  HPI: 80 yo male former smoker followed for emphysema, bronchiectasis and history of Pseudomonas pneumonia (June 2021) Suspected Covid 19 infection 03/2018 with acute resp sx and loss of taste /smell  [facility]  Medical hx significant for A Fib   TEST/EVENTS :  HRCT 09/2019 >Mild centrilobular and paraseptal emphysema with prominent diffuse bronchial wall thickening, suggesting COPD. 2. Mild diffuse cylindrical bronchiectasis with moderate patchy tree-in-bud opacities most prominent in the dependent lower lobes. Findings suggest chronic/recurrent bronchiolitis such as due to recurrent aspiration or atypical mycobacterial infection (MAI). 3. Resolved bilateral lower lobe pneumonia seen on 08/26/2019  PFTs 10/2019 moderate airway obstruction, ratio 57, FEV1 1.39/48%, FVC 60%, no bronchodilator response, and DLCO 69%, TLC normal  CT angiogram chest 08/26/2019 patchy predominantly lower lobe opacities bilateral  CT chest with contrast 05/2019 moderate hiatal hernia small clustered nodules left lower lobe, mild scarring left upper lobe and lingula, mild bronchiectasis to my review  CT chest/abdomen/pelvis 04/2013 clear lungs  Sputum AFB 11/16/2019  Neg .  Sputum culture 11/16/2019 + for Pseudomonas-pansensitive  High-resolution CT chest August 2021 mild emphysema with prominent diffuse bronchial wall thickening, mild bronchiectasis with moderate patchy tree-in-bud opacities in the dependent lower lobes.  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  Resolved bilateral lower lobe pneumonia.  03/31/2020 Follow up : Emphysema, bronchiectasis Patient returns for a 28-month follow-up.  Last visit patient had spirometry that showed normal lung function with no airflow obstruction.  Patient  was instructed that he could stop his Stiolto.  Patient remains on hypertonic nebs most days he uses once a day.  He is not using his flutter valve.  Patient says overall breathing is doing okay.  He has noticed since stopping Stiolto that he is more short of breath.  And is having difficulty coughing up any significant mucus.  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  And since then has been having decreased walking.  He got his stitches out yesterday and is planning on increasing his activity as tolerated.  He denies any hemoptysis chest pain orthopnea PND or fever.  Patient says he has still not regained his taste or smell since 2020.  He says this does affect his appetite.  And his weight has been down over the last year.  He is down 70 pounds.  He is following up with this with his primary care provider.  Patient says that he eats foods but it does has no taste.   Allergies  Allergen Reactions  . Sertraline Other (See Comments)    Jerking and unable to speak well    Immunization History  Administered Date(s) Administered  . Influenza-Unspecified 11/27/2003, 12/28/2003, 11/26/2005, 11/27/2007, 11/26/2008, 10/27/2009, 09/27/2010, 10/28/2011, 10/27/2017, 10/28/2019  . PFIZER(Purple Top)SARS-COV-2 Vaccination 03/25/2019, 04/16/2019, 11/27/2019    Past Medical History:  Diagnosis Date  . Acute recurrent maxillary sinusitis 03/30/2019  . Anosmia 02/18/2019  . APC (atrial premature contractions) 08/28/2016  . Arrhythmia   . CHF (congestive heart failure) ([place])   . Chronic anticoagulation 08/31/2014  . Chronic diastolic heart failure ([place]) 08/31/2014  . Chronic ethmoidal sinusitis 04/28/2019  . Chronic kidney disease   . CKD (chronic kidney disease) stage 3, GFR 30-59 ml/min (HCC) 11/23/2016  .  Coronary artery disease involving native coronary artery of native heart 08/31/2014  . Dyslipidemia 08/28/2016  . Hyperlipidemia   . Hypertension   . Hypertensive  heart and chronic kidney disease with heart failure and stage 1 through stage 4 chronic kidney disease, or unspecified chronic kidney disease ([place]) 08/31/2014  . Microalbuminuria 05/02/2017  . Nasal septal deviation 02/18/2019  . On amiodarone therapy 08/31/2014  . PAF (paroxysmal atrial fibrillation) ([place]) 08/31/2014   Overview:  rapid rate requiring beta blocker , CCB and amiodorone for rate control  . 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 Packs/day: 1.50  . Years: 20.00  . Pack years: 30.00  . Types: Cigarettes  Smokeless Tobacco Former Systems developer  . Types: Chew  Tobacco Comment   Quit in 1975   Counseling given: Not Answered Comment: Quit in 1975   Outpatient Medications Prior to Visit  Medication Sig Dispense Refill  . Alogliptin Benzoate 25 MG TABS Take 12.5 mg by mouth daily.    Marland Kitchen apixaban (ELIQUIS) 5 MG TABS tablet Take 5 mg by mouth 2 (two) times daily.    Marland Kitchen atorvastatin (LIPITOR) 80 MG tablet Take 80 mg by mouth daily.     . citalopram (CELEXA) 20 MG tablet Take 10 mg by mouth daily.    . digoxin (LANOXIN) 0.125 MG tablet Take 1 tablet (0.125 mg total) by mouth daily. 90 tablet 3  . empagliflozin (JARDIANCE) 25 MG TABS tablet Take 25 mg by mouth in the morning. 1/2 tab in am    . glipiZIDE (GLUCOTROL) 5 MG tablet Take 5 mg by mouth. Take 1 tablet (5 mg) am and 0.5 tablet (2.5 mg) pm    . metoprolol tartrate (LOPRESSOR) 50 MG tablet 50 mg. 1/2 tab 3 times per day ( am, afternoon, evening)    . Multiple Vitamin (MULTIVITAMIN) capsule Take 1 capsule by mouth daily.     . Omega-3 Fatty Acids (FISH OIL PO) Take by mouth.    Marland Kitchen omeprazole (PRILOSEC) 20 MG capsule Take 20 mg by mouth daily.    . sodium chloride HYPERTONIC 3 % nebulizer solution Take by nebulization daily. Dx J47.9 120 mL 5  . Tiotropium Bromide-Olodaterol (STIOLTO RESPIMAT)  2.5-2.5 MCG/ACT AERS Inhale 2 puffs into the lungs daily. 4 g 0  . vitamin E 400 UNIT capsule Take 400 Units by mouth daily.    Marland Kitchen losartan (COZAAR) 25 MG tablet Take 25 mg by mouth in the morning and at bedtime. Takes 1/2 tab in Am and 1/2 tab in PM (Patient not taking: Reported on 03/31/2020)    . metoprolol tartrate (LOPRESSOR) 25 MG tablet Take 1 tablet (25 mg total) by mouth 3 (three) times daily. 0.5 tablet twice a day (Patient not taking: Reported on 03/31/2020) 90 tablet 3   No facility-administered medications prior to visit.     Review of Systems:   Constitutional:   No  weight loss, night sweats,  Fevers, chills, + fatigue, or  lassitude.  HEENT:   No headaches,  Difficulty swallowing,  Tooth/dental problems, or  Sore throat,                No sneezing, itching, ear ache, nasal congestion, post nasal drip,   CV:  No chest pain,  Orthopnea, PND, swelling in lower extremities, anasarca, dizziness, palpitations, syncope.   GI  No  heartburn, indigestion, abdominal pain, nausea, vomiting, diarrhea, change in bowel habits, loss of appetite, bloody stools.   Resp:   No excess mucus, no productive cough,  No non-productive cough,  No coughing up of blood.  No change in color of mucus.  No wheezing.  No chest wall deformity  Skin: no rash or lesions.  GU: no dysuria, change in color of urine, no urgency or frequency.  No flank pain, no hematuria   MS:  No joint pain or swelling.  No decreased range of motion.  No back pain.    Physical Exam  BP 130/70 (BP Location: Left Arm, Patient Position: Sitting, Cuff Size: Normal)   Pulse (!) 101   Temp (!) 97.4 F (36.3 C) (Temporal)   Ht 5\' 11"  (1.803 m)   Wt 191 lb 12.8 oz (87 kg)   SpO2 95%   BMI 26.75 kg/m   GEN: A/Ox3; pleasant , NAD, elderly   HEENT:  [place]/AT,  EACs-clear, TMs-wnl, NOSE-clear, THROAT-clear, no lesions, no postnasal drip or exudate noted.   NECK:  Supple w/ fair ROM; no JVD; normal carotid impulses w/o bruits; no  thyromegaly or nodules palpated; no lymphadenopathy.    RESP  Clear  P & A; w/o, wheezes/ rales/ or rhonchi. no accessory muscle use, no dullness to percussion  CARD:  irreg no m/r/g, no peripheral edema, pulses intact, no cyanosis or clubbing.  GI:   Soft & nt; nml bowel sounds; no organomegaly or masses detected.   Musco: Warm bil, no deformities or joint swelling noted.   Neuro: alert, no focal deficits noted.    Skin: Warm, no lesions or rashes    Lab Results:   BMET    Imaging: No results found.    PFT Results Latest Ref Rng & Units 11/11/2019  FVC-Pre L 2.43  FVC-Predicted Pre % 60  FVC-Post L 2.28  FVC-Predicted Post % 56  Pre FEV1/FVC % % 57  Post FEV1/FCV % % 62  FEV1-Pre L 1.39  FEV1-Predicted Pre % 48  FEV1-Post L 1.42  DLCO uncorrected ml/min/mmHg 16.95  DLCO UNC% % 69  DLCO corrected ml/min/mmHg 16.95  DLCO COR %Predicted % 69  DLVA Predicted % 100  TLC L 6.50  TLC % Predicted % 92  RV % Predicted % 152    No results found for: NITRICOXIDE      Assessment & Plan:   Bronchiectasis without complication (HCC) Bronchiectasis-encourage patient on increased pulmonary hygiene/mucociliary clearance.  Have asked him to restart flutter valve twice daily.  Continue on hypertonic nebs daily. Patient does have increased shortness of breath since stopping Stiolto may restart this as [facility].  Plan  Patient Instructions  [facility] puffs [facility]  Activity as tolerated.  Restart Flutter valve Twice daily  .  Continue on Hypertonic nebs daily  Follow up with Dr. Vassie LollAlva  In 4 months  and As needed   Please contact office for sooner follow up if symptoms do not improve or worsen or seek emergency care    '  Emphysema/COPD [facility](HCC) Emphysema.  Increase symptom burden off of Stiolto.  May restart this.  [facility].  Plan  Patient Instructions  Restart Stiolto 2 puffs daily  [facility]  Activity as tolerated.  Restart Flutter valve Twice daily  .  Continue on Hypertonic nebs daily  Follow up with Dr. Vassie LollAlva  In 4 months  and As needed   Please contact office for sooner follow up if symptoms do not improve or worsen or seek emergency care       Weight loss Patient has had significant weight loss over the last 1 to 2 years.  Unclear etiology.  Patient is continue with follow-up with primary care provider.  Recent CT scan without acute process noted.  Sputum AFB was negative.  Patient is encouraged on healthy diet.  Physical deconditioning Patient is continue with activity as tolerated.     Rexene Edison, NP 03/31/2020

## 2020-03-31 NOTE — Patient Instructions (Signed)
Restart Stiolto 2 puffs daily (will send to [place] )  Activity as tolerated.  Restart Flutter valve Twice daily  .  Continue on Hypertonic nebs daily  Follow up with Dr. Elsworth Soho  In 4 months  and As needed   Please contact office for sooner follow up if symptoms do not improve or worsen or seek emergency care

## 2020-03-31 NOTE — Assessment & Plan Note (Signed)
Patient has had significant weight loss over the last 1 to 2 years.  Unclear etiology.  Patient is continue with follow-up with primary care provider.  Recent CT scan without acute process noted.  Sputum AFB was negative.  Patient is encouraged on healthy diet.

## 2020-03-31 NOTE — Assessment & Plan Note (Signed)
Emphysema.  Increase symptom burden off of Stiolto.  May restart this.  He gets his medications through the [place].  Plan  Patient Instructions  Restart Stiolto 2 puffs daily (will send to [facility] )  Activity as tolerated.  Restart Flutter valve Twice daily  .  Continue on Hypertonic nebs daily  Follow up with Dr. Elsworth Soho  In 4 months  and As needed   Please contact office for sooner follow up if symptoms do not improve or worsen or seek emergency care

## 2020-04-14 NOTE — Progress Notes (Signed)
Cardiology Office Note:    Date:  04/15/2020   ID:  Nathaniel Leach, DOB [birth year], MRN [id number]  PCP:  Angelina Sheriff, MD  Cardiologist:  Shirlee More, MD    Referring MD: Angelina Sheriff, MD    ASSESSMENT:    1. Persistent atrial fibrillation ([place])   2. High risk medication use   3. Chronic anticoagulation   4. Hypertensive heart and chronic kidney disease with heart failure and stage 1 through stage 4 chronic kidney disease, or unspecified chronic kidney disease ([place])   5. Dyslipidemia   6. S/P CABG (coronary artery bypass graft)    PLAN:    In order of problems listed above:  1. He is improved his rate is controlled beta-blocker low-dose digoxin and anticoagulated continue the same and I gave him a note for the [place] hospital to do BMP proBNP and digoxin level.  He exhibits no signs of digoxin toxicity. 2. Stable no edema he is no longer on a loop diuretic and blood pressure is at target. 3. Stable hyperlipidemia continue high intensity statin. 4. Stable CAD no anginal discomfort upon bypass surgery on current medical therapy including beta-blocker statin anticoagulant.   Next appointment: 6 months   Medication Adjustments/Labs and Tests Ordered: Current medicines are reviewed at length with the patient today.  Concerns regarding medicines are outlined above.  No orders of the defined types were placed in this encounter.  No orders of the defined types were placed in this encounter.   Chief Complaint  Patient presents with  . Follow-up  . Atrial Fibrillation  . Congestive Heart Failure    History of Present Illness:    Nathaniel Leach is a 80 y.o. male with a hx of CAD stable ischemic heart disease, hypertensive heart and chronic kidney disease with heart failure paroxysmal atrial fibrillation which is now persistent rate controlled with digoxin chronically anticoagulated dyslipidemia and chronic lung disease with emphysema and bronchiectasis last seen  12/09/2019.He underwent a myocardial perfusion study 07/09/2019 showing EF 57% normal left ventricular function and no evidence of ischemia. Echocardiogram 07/03/2019 showed a low normal ejection fraction 50 to 76% normal diastolic filling pressure normal right ventricular function and mild enlargement of the ascending aorta.   Compliance with diet, lifestyle and medications: Yes  He is improved with his chronic lung disease but still has cough and sputum in the mornings.  Not short of breath edema orthopnea chest pain palpitation or syncope he is due for labs at the [facility] hospital and I gave him a note to check BMP proBNP and digoxin level.  He is having no GI symptoms of nausea or anorexia from digoxin although he has lost taste and smell from COVID-19 he tolerates his statin without muscle pain or weakness.  He is anticoagulated without bleeding complication. Past Medical History:  Diagnosis Date  . Acute recurrent maxillary sinusitis 03/30/2019  . Anosmia 02/18/2019  . APC (atrial premature contractions) 08/28/2016  . Arrhythmia   . CHF (congestive heart failure) ([place])   . Chronic anticoagulation 08/31/2014  . Chronic diastolic heart failure ([place]) 08/31/2014  . Chronic ethmoidal sinusitis 04/28/2019  . Chronic kidney disease   . CKD (chronic kidney disease) stage 3, GFR 30-59 ml/min (HCC) 11/23/2016  . Coronary artery disease involving native coronary artery of native heart 08/31/2014  . Dyslipidemia 08/28/2016  . Hyperlipidemia   . Hypertension   . Hypertensive heart and chronic kidney disease with heart failure and stage 1 through stage 4 chronic kidney  disease, or unspecified chronic kidney disease ([place]) 08/31/2014  . Microalbuminuria 05/02/2017  . Nasal septal deviation 02/18/2019  . On amiodarone therapy 08/31/2014  . PAF (paroxysmal atrial fibrillation) ([place]) 08/31/2014   Overview:  rapid rate requiring beta blocker , CCB and amiodorone for rate control  . 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 APPENDECTOMY    . CATARACT EXTRACTION    . CHOLECYSTECTOMY    . CORONARY ARTERY BYPASS GRAFT  2004  . MOUTH SURGERY    . RHINOPLASTY      Current Medications: Current Meds  Medication Sig  . Alogliptin Benzoate 25 MG TABS Take 12.5 mg by mouth daily.  Marland Kitchen apixaban (ELIQUIS) 5 MG TABS tablet Take 5 mg by mouth 2 (two) times daily.  Marland Kitchen atorvastatin (LIPITOR) 80 MG tablet Take 80 mg by mouth daily.   . citalopram (CELEXA) 20 MG tablet Take 10 mg by mouth daily.  . digoxin (LANOXIN) 0.125 MG tablet Take 1 tablet (0.125 mg total) by mouth daily.  . empagliflozin (JARDIANCE) 25 MG TABS tablet Take 25 mg by mouth in the morning. 1/2 tab in am  . fluconazole (DIFLUCAN) 200 MG tablet Take 200 mg by mouth 3 (three) times a week.  Marland Kitchen glipiZIDE (GLUCOTROL) 5 MG tablet Take 5 mg by mouth daily.  . metoprolol tartrate (LOPRESSOR) 50 MG tablet 50 mg. 1/2 tab 3 times per day ( am, afternoon, evening)  . Multiple Vitamin (MULTIVITAMIN) capsule Take 1 capsule by mouth daily.   . Omega-3 Fatty Acids (FISH OIL PO) Take by mouth.  Marland Kitchen omeprazole (PRILOSEC) 20 MG capsule Take 20 mg by mouth daily.  . sodium chloride HYPERTONIC 3 % nebulizer solution Take by nebulization daily. Dx J47.9  . Tiotropium Bromide-Olodaterol (STIOLTO RESPIMAT) 2.5-2.5 MCG/ACT AERS Inhale 2 puffs into the lungs daily.  . vitamin E 400 UNIT capsule Take 400 Units by mouth daily.     Allergies:   Sertraline   Social History   Socioeconomic History  . Marital status: Married    Spouse name: Not on file  . Number of children: Not on file  . Years of education: Not on file  . Highest education level: Not on file  Occupational History  . Not on file  Tobacco Use  . Smoking status: Former Smoker    Packs/day: 1.50    Years: 20.00    Pack years: 30.00    Types: Cigarettes  . Smokeless tobacco: Former Systems developer    Types:  Chew  . Tobacco comment: Quit in 1975  Vaping Use  . Vaping Use: Never used  Substance and Sexual Activity  . Alcohol use: Not Currently  . Drug use: Never  . Sexual activity: Not on file  Other Topics Concern  . Not on file  Social History Narrative  . Not on file   Social Determinants of Health   Financial Resource Strain: Not on file  Food Insecurity: Not on file  Transportation Needs: Not on file  Physical Activity: Not on file  Stress: Not on file  Social Connections: Not on file     Family History: The patient's family history includes CAD in his father; Diabetes in his brother; Stroke in his mother. ROS:   Please see the history of present illness.    All other systems reviewed and are negative.  EKGs/Labs/Other Studies Reviewed:  The following studies were reviewed today:   Recent Labs: 07/02/2019: ALT 23; BNP 35.8; Hemoglobin 13.7; Platelets 249; TSH 2.520 08/04/2019: BUN 10; Creatinine, Ser 1.05; Potassium 3.9; Sodium 132  Recent Lipid Panel    Component Value Date/Time   CHOL 128 10/08/2016 1110   TRIG 258 (H) 10/08/2016 1110   HDL 36 (L) 10/08/2016 1110   CHOLHDL 3.6 10/08/2016 1110   LDLCALC 40 10/08/2016 1110    Physical Exam:    VS:  BP (!) 124/52   Pulse 90   Ht 5\' 11"  (1.803 m)   Wt 188 lb (85.3 kg)   SpO2 95%   BMI 26.22 kg/m     Wt Readings from Last 3 Encounters:  04/15/20 188 lb (85.3 kg)  03/31/20 191 lb 12.8 oz (87 kg)  12/30/19 206 lb 3.2 oz (93.5 kg)     GEN:  Well nourished, well developed in no acute distress HEENT: Normal NECK: No JVD; No carotid bruits LYMPHATICS: No lymphadenopathy CARDIAC: S1 variable irregular rhythm  no murmurs, rubs, gallops RESPIRATORY:  Clear to auscultation without rales, wheezing or rhonchi  ABDOMEN: Soft, non-tender, non-distended MUSCULOSKELETAL:  No edema; No deformity  SKIN: Warm and dry NEUROLOGIC:  Alert and oriented x 3 PSYCHIATRIC:  Normal affect    Signed, Shirlee More, MD   04/15/2020 11:25 AM     [facility]

## 2020-04-15 ENCOUNTER — Ambulatory Visit (INDEPENDENT_AMBULATORY_CARE_PROVIDER_SITE_OTHER): Payer: Medicare Other | Admitting: Cardiology

## 2020-04-15 ENCOUNTER — Other Ambulatory Visit: Payer: Self-pay

## 2020-04-15 ENCOUNTER — Encounter: Payer: Self-pay | Admitting: Cardiology

## 2020-04-15 VITALS — BP 124/52 | HR 90 | Ht 71.0 in | Wt 188.0 lb

## 2020-04-15 DIAGNOSIS — Z7901 Long term (current) use of anticoagulants: Secondary | ICD-10-CM

## 2020-04-15 DIAGNOSIS — I130 Hypertensive heart and chronic kidney disease with heart failure and stage 1 through stage 4 chronic kidney disease, or unspecified chronic kidney disease: Secondary | ICD-10-CM

## 2020-04-15 DIAGNOSIS — I4819 Other persistent atrial fibrillation: Secondary | ICD-10-CM

## 2020-04-15 DIAGNOSIS — E785 Hyperlipidemia, unspecified: Secondary | ICD-10-CM

## 2020-04-15 DIAGNOSIS — Z951 Presence of aortocoronary bypass graft: Secondary | ICD-10-CM | POA: Diagnosis not present

## 2020-04-15 DIAGNOSIS — Z79899 Other long term (current) drug therapy: Secondary | ICD-10-CM

## 2020-04-15 NOTE — Patient Instructions (Signed)
Medication Instructions:  Your physician recommends that you continue on your current medications as directed. Please refer to the Current Medication list given to you today.  *If you need a refill on your cardiac medications before your next appointment, please call your pharmacy*   Lab Work: [facility] have the [facility] draw a BMP,PROBNP, Digoxin level at your next scheduled visit.  If you have labs (blood work) drawn today and your tests are completely normal, you will receive your results only by: Marland Kitchen MyChart Message (if you have MyChart) OR . A paper copy in the mail If you have any lab test that is abnormal or we need to change your treatment, we will call you to review the results.   Testing/Procedures: None   Follow-Up: At [facility], you and your health needs are our priority.  As part of our continuing mission to provide you with exceptional heart care, we have created designated Provider Care Teams.  These Care Teams include your primary Cardiologist (physician) and Advanced Practice Providers (APPs -  Physician Assistants and Nurse Practitioners) who all work together to provide you with the care you need, when you need it.  We recommend signing up for the patient portal called "MyChart".  Sign up information is provided on this After Visit Summary.  MyChart is used to connect with patients for Virtual Visits (Telemedicine).  Patients are able to view lab/test results, encounter notes, upcoming appointments, etc.  Non-urgent messages can be sent to your provider as well.   To learn more about what you can do with MyChart, go to [email or web address].    Your next appointment:   6 month(s)  The format for your next appointment:   In Person  Provider:   Shirlee More, MD   Other Instructions

## 2020-05-06 DIAGNOSIS — J329 Chronic sinusitis, unspecified: Secondary | ICD-10-CM | POA: Diagnosis not present

## 2020-05-06 DIAGNOSIS — J40 Bronchitis, not specified as acute or chronic: Secondary | ICD-10-CM | POA: Diagnosis not present

## 2020-05-06 DIAGNOSIS — I4891 Unspecified atrial fibrillation: Secondary | ICD-10-CM | POA: Diagnosis not present

## 2020-05-06 DIAGNOSIS — J479 Bronchiectasis, uncomplicated: Secondary | ICD-10-CM | POA: Diagnosis not present

## 2020-05-12 DIAGNOSIS — C44729 Squamous cell carcinoma of skin of left lower limb, including hip: Secondary | ICD-10-CM | POA: Diagnosis not present

## 2020-05-12 DIAGNOSIS — L309 Dermatitis, unspecified: Secondary | ICD-10-CM | POA: Diagnosis not present

## 2020-06-28 DIAGNOSIS — J302 Other seasonal allergic rhinitis: Secondary | ICD-10-CM | POA: Diagnosis not present

## 2020-06-28 DIAGNOSIS — Z6825 Body mass index (BMI) 25.0-25.9, adult: Secondary | ICD-10-CM | POA: Diagnosis not present

## 2020-06-28 DIAGNOSIS — J479 Bronchiectasis, uncomplicated: Secondary | ICD-10-CM | POA: Diagnosis not present

## 2020-06-28 DIAGNOSIS — Z9119 Patient's noncompliance with other medical treatment and regimen: Secondary | ICD-10-CM | POA: Diagnosis not present

## 2020-07-13 ENCOUNTER — Other Ambulatory Visit: Payer: Self-pay

## 2020-07-13 ENCOUNTER — Encounter: Payer: Self-pay | Admitting: Adult Health

## 2020-07-13 ENCOUNTER — Ambulatory Visit (INDEPENDENT_AMBULATORY_CARE_PROVIDER_SITE_OTHER): Payer: Medicare Other | Admitting: Adult Health

## 2020-07-13 DIAGNOSIS — J431 Panlobular emphysema: Secondary | ICD-10-CM

## 2020-07-13 DIAGNOSIS — R5381 Other malaise: Secondary | ICD-10-CM | POA: Diagnosis not present

## 2020-07-13 DIAGNOSIS — J479 Bronchiectasis, uncomplicated: Secondary | ICD-10-CM

## 2020-07-13 NOTE — Assessment & Plan Note (Signed)
Stable on present- regimen  Encouraged on mucocillary clearance regimen   Plan  Patient Instructions  Continue on Stiolto 2 puffs daily Activity as tolerated.  Restart Flutter valve Twice daily  .  Continue on Hypertonic nebs 1-2 times a day .  Follow up with Dr. Elsworth Soho  In 4 months  and As needed   Please contact office for sooner follow up if symptoms do not improve or worsen or seek emergency care

## 2020-07-13 NOTE — Patient Instructions (Signed)
Continue on Stiolto 2 puffs daily Activity as tolerated.  Restart Flutter valve Twice daily  .  Continue on Hypertonic nebs 1-2 times a day .  Follow up with Dr. Elsworth Soho  In 4 months  and As needed   Please contact office for sooner follow up if symptoms do not improve or worsen or seek emergency care

## 2020-07-13 NOTE — Assessment & Plan Note (Signed)
Restart flutter valve  Increase Hypertonic neb Twice daily  X 1 week then daily   Plan  Patient Instructions  Continue on Stiolto 2 puffs daily Activity as tolerated.  Restart Flutter valve Twice daily  .  Continue on Hypertonic nebs 1-2 times a day .  Follow up with Dr. Elsworth Soho  In 4 months  and As needed   Please contact office for sooner follow up if symptoms do not improve or worsen or seek emergency care

## 2020-07-13 NOTE — Progress Notes (Signed)
@Patient  ID: Deland Pretty, male    DOB: 01/14/1941, 80 y.o.   MRN: [id number]  Chief Complaint  Patient presents with  . Follow-up    Referring provider: Angelina Sheriff, MD  HPI: 80 year old male former smoker followed for emphysema, bronchiectasis and history of Pseudomonas pneumonia and June 2021 Suspected COVID-19 infection in February 2020 with acute respiratory infection with loss of taste and smell. Patient does get his care and medications through the [place] system. Medical history significant for A. fib.  TEST/EVENTS :  HRCT 09/2019 >Mild centrilobular and paraseptal emphysema with prominent diffuse bronchial wall thickening, suggesting COPD. 2. Mild diffuse cylindrical bronchiectasis with moderate patchy tree-in-bud opacities most prominent in the dependent lower lobes. Findings suggest chronic/recurrent bronchiolitis such as due to recurrent aspiration or atypical mycobacterial infection (MAI). 3. Resolved bilateral lower lobe pneumonia seen on 08/26/2019  PFTs 10/2019 moderate airway obstruction, ratio 57, FEV1 1.39/48%, FVC 60%, no bronchodilator response, and DLCO 69%, TLC normal  CT angiogram chest 08/26/2019 patchy predominantly lower lobe opacities bilateral  CT chest with contrast 05/2019 moderate hiatal hernia small clustered nodules left lower lobe, mild scarring left upper lobe and lingula, mild bronchiectasis to my review  CT chest/abdomen/pelvis 04/2013 clear lungs  Sputum AFB 11/16/2019  Neg .  Sputum culture 11/16/2019 + for Pseudomonas-pansensitive  High-resolution CT chest August 2021 mild emphysema with prominent diffuse bronchial wall thickening, mild bronchiectasis with moderate patchy tree-in-bud opacities in the dependent lower lobes.  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  Resolved bilateral lower lobe pneumonia.  07/13/2020 Follow up : Emphysema and Bronchiectasis  Patient presents for a 76-month follow-up.  Patient  has underlying emphysema and bronchiectasis.  Spirometry and 2021 showed normal lung function with no airflow obstruction.  Patient remains on hypertonic nebs once daily.  Not using flutter valve  Last visit Stiolto was restarted due to increased symptom burden after stopping it. Patient says he was doing well up until about 2 weeks ago.  Started having some increased cough and shortness of breath.  Was seen by his primary care provider.  And given a steroid injection. Feels better back to baseline . Most days has some cough with congestion .  No fever, discolored mucus , chest pain or edema  Feels okay .  Has never regained smell or taste since 03/2018. Eats but appetite is not as good.  Covid vaccines x 4.  Remains active , lives alone , wife passed away 10 yrs ago, drives, light housework , shopping. Adult kids , 1 local , 1 in [place].  Helps with grandkid.     Allergies  Allergen Reactions  . Sertraline Other (See Comments)    Jerking and unable to speak well    Immunization History  Administered Date(s) Administered  . Influenza-Unspecified 11/27/2003, 12/28/2003, 11/26/2005, 11/27/2007, 11/26/2008, 10/27/2009, 09/27/2010, 10/28/2011, 10/27/2017, 10/28/2019  . PFIZER Comirnaty(Gray Top)Covid-19 Tri-Sucrose Vaccine 07/05/2020  . PFIZER(Purple Top)SARS-COV-2 Vaccination 03/25/2019, 04/16/2019, 11/27/2019    Past Medical History:  Diagnosis Date  . Acute recurrent maxillary sinusitis 03/30/2019  . Anosmia 02/18/2019  . APC (atrial premature contractions) 08/28/2016  . Arrhythmia   . CHF (congestive heart failure) ([place])   . Chronic anticoagulation 08/31/2014  . Chronic diastolic heart failure ([place]) 08/31/2014  . Chronic ethmoidal sinusitis 04/28/2019  . Chronic kidney disease   . CKD (chronic kidney disease) stage 3, GFR 30-59 ml/min (HCC) 11/23/2016  . Coronary artery disease involving native coronary artery of native heart 08/31/2014  . Dyslipidemia 08/28/2016  .  Hyperlipidemia   .  Hypertension   . Hypertensive heart and chronic kidney disease with heart failure and stage 1 through stage 4 chronic kidney disease, or unspecified chronic kidney disease ([place]) 08/31/2014  . Microalbuminuria 05/02/2017  . Nasal septal deviation 02/18/2019  . On amiodarone therapy 08/31/2014  . PAF (paroxysmal atrial fibrillation) ([place]) 08/31/2014   Overview:  rapid rate requiring beta blocker , CCB and amiodorone for rate control  . 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 Packs/day: 1.50  . Years: 20.00  . Pack years: 30.00  . Types: Cigarettes  Smokeless Tobacco Former Systems developer  . Types: Chew  Tobacco Comment   Quit in 1975   Counseling given: Not Answered Comment: Quit in 1975   Outpatient Medications Prior to Visit  Medication Sig Dispense Refill  . Alogliptin Benzoate 25 MG TABS Take 12.5 mg by mouth daily.    Marland Kitchen apixaban (ELIQUIS) 5 MG TABS tablet Take 5 mg by mouth 2 (two) times daily.    Marland Kitchen atorvastatin (LIPITOR) 80 MG tablet Take 80 mg by mouth daily.     . citalopram (CELEXA) 20 MG tablet Take 10 mg by mouth daily.    . digoxin (LANOXIN) 0.125 MG tablet Take 1 tablet (0.125 mg total) by mouth daily. 90 tablet 3  . empagliflozin (JARDIANCE) 25 MG TABS tablet Take 25 mg by mouth in the morning. 1/2 tab in am    . fluconazole (DIFLUCAN) 200 MG tablet Take 200 mg by mouth 3 (three) times a week.    . metoprolol tartrate (LOPRESSOR) 50 MG tablet Take 100 mg by mouth 2 (two) times daily.    . Multiple Vitamin (MULTIVITAMIN) capsule Take 1 capsule by mouth daily.     . Omega-3 Fatty Acids (FISH OIL PO) Take by mouth.    Marland Kitchen omeprazole (PRILOSEC) 20 MG capsule Take 20 mg by mouth daily.    . sodium chloride HYPERTONIC 3 % nebulizer solution Take by nebulization daily. Dx J47.9 120 mL 5  . Tiotropium Bromide-Olodaterol (STIOLTO RESPIMAT) 2.5-2.5  MCG/ACT AERS Inhale 2 puffs into the lungs daily. 4 g 0  . vitamin E 400 UNIT capsule Take 400 Units by mouth daily.    Marland Kitchen glipiZIDE (GLUCOTROL) 5 MG tablet Take 5 mg by mouth daily. (Patient not taking: Reported on 07/13/2020)     No facility-administered medications prior to visit.     Review of Systems:   Constitutional:   No  weight loss, night sweats,  Fevers, chills,  +fatigue, or  lassitude.  HEENT:   No headaches,  Difficulty swallowing,  Tooth/dental problems, or  Sore throat,                No sneezing, itching, ear ache, nasal congestion, post nasal drip,   CV:  No chest pain,  Orthopnea, PND, swelling in lower extremities, anasarca, dizziness, palpitations, syncope.   GI  No heartburn, indigestion, abdominal pain, nausea, vomiting, diarrhea, change in bowel habits, loss of appetite, bloody stools.   Resp:   No chest wall deformity  Skin: no rash or lesions.  GU: no dysuria, change in color of urine, no urgency or frequency.  No flank pain, no hematuria   MS:  No joint pain or swelling.  No decreased range of motion.  No back pain.    Physical Exam  BP  126/74 (BP Location: Left Arm, Patient Position: Sitting, Cuff Size: Normal)   Pulse 78   Temp (!) 96.1 F (35.6 C) (Temporal)   Ht 5\' 11"  (1.803 m)   Wt 175 lb (79.4 kg)   SpO2 96%   BMI 24.41 kg/m   GEN: A/Ox3; pleasant , NAD, well nourished    HEENT:  [place]/AT,    NOSE-clear, THROAT-clear, no lesions, no postnasal drip or exudate noted.   NECK:  Supple w/ fair ROM; no JVD; normal carotid impulses w/o bruits; no thyromegaly or nodules palpated; no lymphadenopathy.    RESP  BB crackles no accessory muscle use, no dullness to percussion  CARD:  RRR, no m/r/g, tr  peripheral edema, pulses intact, no cyanosis or clubbing.  GI:   Soft & nt; nml bowel sounds; no organomegaly or masses detected.   Musco: Warm bil, no deformities or joint swelling noted.   Neuro: alert, no focal deficits noted.    Skin: Warm,  no lesions or rashes    Lab Results:   BNP    Component Value Date/Time   BNP 35.8 07/02/2019 0859    ProBNP    Component Value Date/Time   PROBNP 171 09/12/2017 1147    Imaging: No results found.    PFT Results Latest Ref Rng & Units 11/11/2019  FVC-Pre L 2.43  FVC-Predicted Pre % 60  FVC-Post L 2.28  FVC-Predicted Post % 56  Pre FEV1/FVC % % 57  Post FEV1/FCV % % 62  FEV1-Pre L 1.39  FEV1-Predicted Pre % 48  FEV1-Post L 1.42  DLCO uncorrected ml/min/mmHg 16.95  DLCO UNC% % 69  DLCO corrected ml/min/mmHg 16.95  DLCO COR %Predicted % 69  DLVA Predicted % 100  TLC L 6.50  TLC % Predicted % 92  RV % Predicted % 152    No results found for: NITRICOXIDE      Assessment & Plan:   Emphysema/COPD (HCC) Stable on present- regimen  Encouraged on mucocillary clearance regimen   Plan  Patient Instructions  Continue on Stiolto 2 puffs daily Activity as tolerated.  Restart Flutter valve Twice daily  .  Continue on Hypertonic nebs 1-2 times a day .  Follow up with Dr. Elsworth Soho  In 4 months  and As needed   Please contact office for sooner follow up if symptoms do not improve or worsen or seek emergency care      Physical deconditioning Activity as tolerated.  High protein diet   Bronchiectasis without complication (HCC) Restart flutter valve  Increase Hypertonic neb Twice daily  X 1 week then daily   Plan  Patient Instructions  Continue on Stiolto 2 puffs daily Activity as tolerated.  Restart Flutter valve Twice daily  .  Continue on Hypertonic nebs 1-2 times a day .  Follow up with Dr. Elsworth Soho  In 4 months  and As needed   Please contact office for sooner follow up if symptoms do not improve or worsen or seek emergency care        I spent   30 minutes dedicated to the care of this patient on the date of this encounter to include pre-visit review of records, face-to-face time with the patient discussing conditions above, post visit ordering of  testing, clinical documentation with the electronic health record, making appropriate referrals as documented, and communicating necessary findings to members of the patients care team.    Rexene Edison, NP 07/13/2020

## 2020-07-13 NOTE — Assessment & Plan Note (Addendum)
Activity as tolerated.  High-protein diet 

## 2020-08-08 DIAGNOSIS — J479 Bronchiectasis, uncomplicated: Secondary | ICD-10-CM | POA: Diagnosis not present

## 2020-08-08 DIAGNOSIS — J441 Chronic obstructive pulmonary disease with (acute) exacerbation: Secondary | ICD-10-CM | POA: Diagnosis not present

## 2020-08-23 DIAGNOSIS — Z20828 Contact with and (suspected) exposure to other viral communicable diseases: Secondary | ICD-10-CM | POA: Diagnosis not present

## 2020-09-06 DIAGNOSIS — L309 Dermatitis, unspecified: Secondary | ICD-10-CM | POA: Diagnosis not present

## 2020-09-06 DIAGNOSIS — L820 Inflamed seborrheic keratosis: Secondary | ICD-10-CM | POA: Diagnosis not present

## 2020-09-07 ENCOUNTER — Telehealth: Payer: Self-pay | Admitting: Pulmonary Disease

## 2020-09-08 NOTE — Telephone Encounter (Signed)
Nothing noted in message. Will close encounter.  

## 2020-09-27 ENCOUNTER — Telehealth: Payer: Self-pay | Admitting: Pulmonary Disease

## 2020-09-27 DIAGNOSIS — J479 Bronchiectasis, uncomplicated: Secondary | ICD-10-CM

## 2020-09-27 MED ORDER — AZITHROMYCIN 250 MG PO TABS: ORAL_TABLET | ORAL | 0 refills | Status: DC

## 2020-09-27 NOTE — Telephone Encounter (Signed)
Recommend Sputum culture and sputum AFB , can drop off at lab after he collects.  Make sure he gets rescheduled for ov for follow up . Needs ov with chest xray .   Can send in [place] # 1 to see if this helps  Mucinex As needed    Please contact office for sooner follow up if symptoms do not improve or worsen or seek emergency care

## 2020-09-27 NOTE — Telephone Encounter (Signed)
Primary Pulmonologist: Elsworth Soho Last office visit and with whom: 07/13/20 with TP What do we see them for (pulmonary problems): bronchiectasis, emphysema, SOB Last OV assessment/plan:  Assessment & Plan:    Emphysema/COPD ([place]) Stable on present- regimen Encouraged on mucocillary clearance regimen   Plan  Patient Instructions  Continue on Stiolto 2 puffs daily Activity as tolerated. Restart Flutter valve Twice daily  . Continue on Hypertonic nebs 1-2 times a day . Follow up with Dr. Elsworth Soho  In 4 months  and As needed   Please contact office for sooner follow up if symptoms do not improve or worsen or seek emergency care        Physical deconditioning Activity as tolerated.  High protein diet    Bronchiectasis without complication (HCC) Restart flutter valve Increase Hypertonic neb Twice daily  X 1 week then daily    Plan  Patient Instructions  Continue on Stiolto 2 puffs daily Activity as tolerated. Restart Flutter valve Twice daily  . Continue on Hypertonic nebs 1-2 times a day . Follow up with Dr. Elsworth Soho  In 4 months  and As needed   Please contact office for sooner follow up if symptoms do not improve or worsen or seek emergency care   Was appointment offered to patient (explain)?  Pt had appt scheduled 8/3 but had to be cancelled due to provider being out of the office; no avail openings   Reason for call: Called and spoke with pt. Pt was scheduled to have an appt with BW tomorrow 8/3 but appt had to be cancelled due to BW being out of the office and there are no other availabilities.  Pt has had complaints of cough, chest congestion, increased SOB, and wheezing that has been going on for months. Pt has been at our office multiple times recently due to this.  Pt said when he coughs, he gets up brown phlegm. States the cough is worse in the mornings. With pt's wheezing, he said that it seems to be worse at nights that sometimes it makes it to where he can't sleep.  Pt uses  his stiolto inhaler every day and uses his hypertonic solution every day which he seems to only help for about 92mn-1 hour.  Pt takes guaifenesin '600mg'$  bid. Pt denies any complaints of any fever, had not checked temp but states that he does not feel feverish.  Pt was prescribed medrol dosepak on 08/08/20 by PCP and also prescribed doxycycline on 08/22/20 by PCP which pt said that he cannot tell that either med helped him any.  Pt wants to know if there is anything we can recommend to help, especially since pt is not able to come in to the office for his original appt.  Since TP has seen pt before, sending this to her.  Tammy, please advise.   Allergies  Allergen Reactions   Sertraline Other (See Comments)    Jerking and unable to speak well    Immunization History  Administered Date(s) Administered   Influenza-Unspecified 11/27/2003, 12/28/2003, 11/26/2005, 11/27/2007, 11/26/2008, 10/27/2009, 09/27/2010, 10/28/2011, 10/27/2017, 10/28/2019   PFIZER Comirnaty(Gray Top)Covid-19 Tri-Sucrose Vaccine 07/05/2020   PFIZER(Purple Top)SARS-COV-2 Vaccination 03/25/2019, 04/16/2019, 11/27/2019

## 2020-09-27 NOTE — Telephone Encounter (Signed)
Called and spoke with pt letting him know the res stated by TP and he verbalized understanding. Pt's appt has been rescheduled for pt to see TP Fri. 8/5. Pt said he will get specimen cups from his daughter-n-law who works at a lab closer to where he lives as pt lives in [place] and pt will bring the samples to [place] once collected. Rx for zpak has been sent to pharmacy for pt. Nothing further needed.

## 2020-09-28 ENCOUNTER — Ambulatory Visit: Payer: Medicare Other | Admitting: Primary Care

## 2020-09-30 ENCOUNTER — Ambulatory Visit (INDEPENDENT_AMBULATORY_CARE_PROVIDER_SITE_OTHER): Payer: Medicare Other | Admitting: Adult Health

## 2020-09-30 ENCOUNTER — Other Ambulatory Visit: Payer: Self-pay

## 2020-09-30 ENCOUNTER — Encounter: Payer: Self-pay | Admitting: Adult Health

## 2020-09-30 ENCOUNTER — Ambulatory Visit (INDEPENDENT_AMBULATORY_CARE_PROVIDER_SITE_OTHER): Payer: Medicare Other

## 2020-09-30 DIAGNOSIS — R062 Wheezing: Secondary | ICD-10-CM | POA: Diagnosis not present

## 2020-09-30 DIAGNOSIS — J479 Bronchiectasis, uncomplicated: Secondary | ICD-10-CM

## 2020-09-30 DIAGNOSIS — J300 Vasomotor rhinitis: Secondary | ICD-10-CM

## 2020-09-30 DIAGNOSIS — J441 Chronic obstructive pulmonary disease with (acute) exacerbation: Secondary | ICD-10-CM | POA: Diagnosis not present

## 2020-09-30 DIAGNOSIS — R059 Cough, unspecified: Secondary | ICD-10-CM | POA: Diagnosis not present

## 2020-09-30 MED ORDER — ATROVENT HFA 17 MCG/ACT IN AERS: 2.0000 | INHALATION_SPRAY | Freq: Two times a day (BID) | RESPIRATORY_TRACT | 12 refills | Status: DC

## 2020-09-30 NOTE — Assessment & Plan Note (Signed)
Acute COPD exacerbation Sputum culture and sputum AFB pending.  Patient is to finish antibiotics as discussed.  Aggressive mucociliary clearance.  Plan  Patient Instructions  Finish Zithromax.  Sputum culture .  Use Claritin '10mg'$  At bedtime  As needed   Mucinex Twice daily  As needed   Add Atrovent Nasal Twice daily  As needed  drippy nose.  Continue on Stiolto 2 puffs daily Activity as tolerated.  Restart Flutter valve Twice daily  .  Continue on Hypertonic nebs 1-2 times a day .  Follow up with Dr. Elsworth Soho  In 4 months  and As needed   Please contact office for sooner follow up if symptoms do not improve or worsen or seek emergency care

## 2020-09-30 NOTE — Progress Notes (Signed)
$'@Patient'u$  ID: Nathaniel Leach, male    DOB: 07-22-1940, 80 y.o.   MRN: [id number]  Chief Complaint  Patient presents with   Follow-up    Referring provider: Angelina Sheriff, MD  HPI: 80 year old male former smoker followed for emphysema, bronchiectasis and a history of Pseudomonas pneumonia Suspected COVID-19 infection in February 2020 with acute respiratory failure and loss of taste and smell. Patient does get his care and medications through the [place] system Medical history significant for A. fib  TEST/EVENTS :  HRCT 09/2019 > Mild centrilobular and paraseptal emphysema with prominent diffuse bronchial wall thickening, suggesting COPD. 2. Mild diffuse cylindrical bronchiectasis with moderate patchy tree-in-bud opacities most prominent in the dependent lower lobes. Findings suggest chronic/recurrent bronchiolitis such as due to recurrent aspiration or atypical mycobacterial infection (MAI). 3. Resolved bilateral lower lobe pneumonia seen on 08/26/2019    PFTs 10/2019 moderate airway obstruction, ratio 57, FEV1 1.39/48%, FVC 60%, no bronchodilator response, and DLCO 69%, TLC normal   CT angiogram chest 08/26/2019 patchy predominantly lower lobe opacities bilateral   CT chest with contrast 05/2019 moderate hiatal hernia small clustered nodules left lower lobe, mild scarring left upper lobe and lingula, mild bronchiectasis to my review   CT chest/abdomen/pelvis 04/2013 clear lungs   Sputum AFB 11/16/2019  Neg .  Sputum culture 11/16/2019 + for Pseudomonas-pansensitive   High-resolution CT chest August 2021 mild emphysema with prominent diffuse bronchial wall thickening, mild bronchiectasis with moderate patchy tree-in-bud opacities in the dependent lower lobes.  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  Resolved bilateral lower lobe pneumonia.  09/30/2020 Follow up : Emphysema and bronchiectasis Patient returns for follow-up.  Has underlying emphysema and  bronchiectasis.  Complains of ongoing cough and congestion that is been worse for the last few weeks.  He remains on Stiolto daily.  Uses his hypertonic nebs each day.  Uses flutter valve some but not every day.  He was called in a Z-Pak 3 days ago.  Patient says he has seen some improvement since starting this.  He was also recommended to leave a sputum culture and sputum AFB which he has done that today.  Chest x-ray today shows chronic changes without acute process noted.  Patient denies any hemoptysis, chest pain, orthopnea, nausea vomiting or diarrhea. COVID vaccines x4. Patient is widowed, lives alone.  Helps with his grandkid. Patient complains of drippy nose and drainage that seems to be getting worse as he gets older.  Allergies  Allergen Reactions   Sertraline Other (See Comments)    Jerking and unable to speak well    Immunization History  Administered Date(s) Administered   Influenza Split 12/28/2014   Influenza, High Dose Seasonal PF 11/26/2012, 11/26/2013, 11/11/2015, 12/05/2016   Influenza-Unspecified 11/27/2003, 12/28/2003, 11/26/2005, 11/27/2007, 11/26/2008, 10/27/2009, 09/27/2010, 10/28/2011, 10/27/2017, 10/28/2019   PFIZER Comirnaty(Gray Top)Covid-19 Tri-Sucrose Vaccine 07/05/2020   PFIZER(Purple Top)SARS-COV-2 Vaccination 03/25/2019, 04/16/2019, 11/27/2019, 01/03/2020   Pneumococcal Conjugate-13 06/09/2013   Pneumococcal-Unspecified 09/26/2005   Td (Adult) 11/08/2016   Tdap 12/28/2006    Past Medical History:  Diagnosis Date   Acute recurrent maxillary sinusitis 03/30/2019   Anosmia 02/18/2019   APC (atrial premature contractions) 08/28/2016   Arrhythmia    CHF (congestive heart failure) (HCC)    Chronic anticoagulation 08/31/2014   Chronic diastolic heart failure ([place]) 08/31/2014   Chronic ethmoidal sinusitis 04/28/2019   Chronic kidney disease    CKD (chronic kidney disease) stage 3, GFR 30-59 ml/min (HCC) 11/23/2016   Coronary artery disease involving native coronary  artery of native heart 08/31/2014   Dyslipidemia 08/28/2016   Hyperlipidemia    Hypertension    Hypertensive heart and chronic kidney disease with heart failure and stage 1 through stage 4 chronic kidney disease, or unspecified chronic kidney disease ([place]) 08/31/2014   Microalbuminuria 05/02/2017   Nasal septal deviation 02/18/2019   On amiodarone therapy 08/31/2014   PAF (paroxysmal atrial fibrillation) ([place]) 08/31/2014   Overview:  rapid rate requiring beta blocker , CCB and amiodorone for rate control   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     atorvastatin (LIPITOR) 80 MG tablet Take 80 mg by mouth daily.      citalopram (CELEXA) 20 MG tablet Take 10 mg by mouth daily.     digoxin (LANOXIN) 0.125 MG tablet Take 1 tablet (0.125 mg total) by mouth daily. 90 tablet 3   empagliflozin (JARDIANCE) 25 MG TABS tablet Take 25 mg by mouth in the morning. 1/2 tab in am     glipiZIDE (GLUCOTROL) 5 MG tablet Take 2.5 mg by mouth daily.     guaiFENesin (MUCINEX) 600 MG 12 hr tablet Take 600 mg by mouth 2 (two) times daily.     metoprolol tartrate (LOPRESSOR) 50 MG tablet Take 100 mg by mouth 2 (two) times daily.     Multiple Vitamin (MULTIVITAMIN) capsule Take 1 capsule by mouth daily.      Omega-3 Fatty Acids (FISH OIL PO) Take by mouth.     omeprazole (PRILOSEC) 20 MG capsule Take 20 mg by mouth daily.     sodium chloride HYPERTONIC 3 % nebulizer solution Take by nebulization daily. Dx J47.9 120 mL 5    Tiotropium Bromide-Olodaterol (STIOLTO RESPIMAT) 2.5-2.5 MCG/ACT AERS Inhale 2 puffs into the lungs daily. 4 g 0   vitamin E 400 UNIT capsule Take 400 Units by mouth daily.     Alogliptin Benzoate 25 MG TABS Take 12.5 mg by mouth daily. (Patient not taking: Reported on 09/30/2020)     azithromycin (ZITHROMAX) 250 MG tablet Take two today and then one daily until finished. (Patient not taking: Reported on 09/30/2020) 6 tablet 0   fluconazole (DIFLUCAN) 200 MG tablet Take 200 mg by mouth 3 (three) times a week. (Patient not taking: Reported on 09/30/2020)     No facility-administered medications prior to visit.     Review of Systems:   Constitutional:   No  weight loss, night sweats,  Fevers, chills, +fatigue, or  lassitude.  HEENT:   No headaches,  Difficulty swallowing,  Tooth/dental problems, or  Sore throat,                No sneezing, itching, ear ache,  +nasal congestion, post nasal drip,   CV:  No chest pain,  Orthopnea, PND, swelling in lower extremities, anasarca, dizziness, palpitations, syncope.   GI  No heartburn, indigestion, abdominal pain, nausea, vomiting, diarrhea, change in bowel habits, loss of appetite, bloody stools.  Resp:   No chest wall deformity  Skin: no rash or lesions.  GU: no dysuria, change in color of urine, no urgency or frequency.  No flank pain, no hematuria   MS:  No joint pain or swelling.  No decreased range of motion.  No back pain.    Physical Exam  BP 118/78 (BP Location: Left Arm, Patient Position: Sitting, Cuff Size: Normal)   Pulse 83   Temp 98.1 F (36.7 C) (Oral)   Ht '5\' 11"'$  (1.803 m)   Wt 171 lb 12.8 oz (77.9 kg)   SpO2 97%   BMI 23.96 kg/m   GEN: A/Ox3; pleasant , NAD, well nourished    HEENT:  [place]/AT,  EACs-clear, TMs-wnl, NOSE-clear, THROAT-clear, no lesions, no postnasal drip or exudate noted.   NECK:  Supple w/ fair ROM; no JVD; normal carotid impulses w/o bruits; no thyromegaly or nodules palpated; no lymphadenopathy.     RESP  few scattered rhonchi , no accessory muscle use, no dullness to percussion  CARD:  RRR, no m/r/g, tr  peripheral edema, pulses intact, no cyanosis or clubbing.  GI:   Soft & nt; nml bowel sounds; no organomegaly or masses detected.   Musco: Warm bil, no deformities or joint swelling noted.   Neuro: alert, no focal deficits noted.    Skin: Warm, no lesions or rashes    Lab Results:        Imaging: DG Chest 2 View  Result Date: 09/30/2020 CLINICAL DATA:  cough, congestion, wheezing EXAM: CHEST - 2 VIEW COMPARISON:  Radiograph 12/30/2019 FINDINGS: Unchanged cardiomediastinal silhouette. There are chronic bilateral reticulonodular opacities and diffuse bronchiectasis with bronchial wall thickening. No new focal airspace disease. No large pleural effusion or visible pneumothorax. No acute osseous abnormality. Thoracic spondylosis. IMPRESSION: Chronic bilateral reticulonodular opacities and diffuse bronchiectasis. No new focal airspace consolidation. Electronically Signed   By: Maurine Simmering   On: 09/30/2020 14:18      PFT Results Latest Ref Rng & Units 11/11/2019  FVC-Pre L 2.43  FVC-Predicted Pre % 60  FVC-Post L 2.28  FVC-Predicted Post % 56  Pre FEV1/FVC % % 57  Post FEV1/FCV % % 62  FEV1-Pre L 1.39  FEV1-Predicted Pre % 48  FEV1-Post L 1.42  DLCO uncorrected ml/min/mmHg 16.95  DLCO UNC% % 69  DLCO corrected ml/min/mmHg 16.95  DLCO COR %Predicted % 69  DLVA Predicted % 100  TLC L 6.50  TLC % Predicted % 92  RV % Predicted % 152    No results found for: NITRICOXIDE      Assessment & Plan:   COPD with acute exacerbation (HCC) Acute COPD exacerbation Sputum culture and sputum AFB pending.  Patient is to finish antibiotics as discussed.  Aggressive mucociliary clearance.  Plan  Patient Instructions  Finish Zithromax.  Sputum culture .  Use Claritin '10mg'$  At bedtime  As needed   Mucinex Twice daily  As needed   Add Atrovent Nasal Twice daily  As needed   drippy nose.  Continue on Stiolto 2 puffs daily Activity as tolerated.  Restart Flutter valve Twice daily  .  Continue on Hypertonic nebs 1-2 times a day .  Follow up with Dr. Elsworth Soho  In 4 months  and As needed   Please contact office for sooner follow up if symptoms do not improve or worsen or seek emergency care       Vasomotor rhinitis Saline nasal rinses.  Add an Atrovent as needed   I spent 34  minutes dedicated to the care of this patient on the date of this encounter to include pre-visit review of records, face-to-face time with the patient discussing conditions above, post visit ordering of testing, clinical documentation with the electronic health record, making appropriate referrals as documented, and communicating necessary findings to members of the patients care team.    Rexene Edison, NP 09/30/2020

## 2020-09-30 NOTE — Patient Instructions (Signed)
Finish Zithromax.  Sputum culture .  Use Claritin '10mg'$  At bedtime  As needed   Mucinex Twice daily  As needed   Add Atrovent Nasal Twice daily  As needed  drippy nose.  Continue on Stiolto 2 puffs daily Activity as tolerated.  Restart Flutter valve Twice daily  .  Continue on Hypertonic nebs 1-2 times a day .  Follow up with Dr. Elsworth Soho  In 4 months  and As needed   Please contact office for sooner follow up if symptoms do not improve or worsen or seek emergency care

## 2020-09-30 NOTE — Assessment & Plan Note (Signed)
Saline nasal rinses.  Add an Atrovent as needed

## 2020-10-03 LAB — RESPIRATORY CULTURE OR RESPIRATORY AND SPUTUM CULTURE
MICRO NUMBER:: 12207528
RESULT:: NORMAL
SPECIMEN QUALITY:: ADEQUATE

## 2020-10-04 NOTE — Progress Notes (Signed)
Called and spoke with patient, advised of results/recommendations per Rexene Edison NP.  He verbalized understanding.  He states he was unable to get the Ipratropium inhaler filled because it was $400, he goes to the [place] and states he will find out how much will cost there and let us know.  Nothing further needed.

## 2020-10-14 ENCOUNTER — Ambulatory Visit: Payer: Medicare Other | Admitting: Cardiology

## 2020-11-29 ENCOUNTER — Telehealth: Payer: Self-pay | Admitting: Pulmonary Disease

## 2020-11-29 NOTE — Telephone Encounter (Signed)
RA please advise if a note can be faxed per pts request so that the [place] can dispense the 12 hour mucinex.  Last ov here was with TP on 09/30/20

## 2020-12-01 NOTE — Telephone Encounter (Signed)
I have sent the letter to Nathaniel Leach, [place].  Nothinig further is needed.

## 2020-12-10 LAB — AFB CULTURE WITH SMEAR (NOT AT ARMC)
Acid Fast Culture: NEGATIVE
Acid Fast Smear: NEGATIVE

## 2020-12-13 NOTE — Progress Notes (Signed)
Cardiology Office Note:    Date:  12/14/2020   ID:  Nathaniel Leach, DOB [birth year], MRN [id number]  PCP:  Angelina Sheriff, MD  Cardiologist:  Shirlee More, MD    Referring MD: Angelina Sheriff, MD    ASSESSMENT:    1. Persistent atrial fibrillation ([place])   2. High risk medication use   3. Chronic anticoagulation   4. Coronary artery disease involving native coronary artery of native heart without angina pectoris   5. S/P CABG (coronary artery bypass graft)   6. Hypertensive heart and chronic kidney disease with heart failure and stage 1 through stage 4 chronic kidney disease, or unspecified chronic kidney disease ([place])   7. Dyslipidemia    PLAN:    In order of problems listed above:  He continues in rate controlled atrial fibrillation on beta-blocker digoxin and anticoagulated.  Pulmonary spoke to him at the [place] about referral for A. fib ablation I told him he has been in atrial fibrillation for so long I do not think he is a candidate for invasive procedures. He has loss of appetite attributed to COVID-19 we will reduce digoxin minimum dose and recheck a level in a week Stable CAD having no anginal discomfort [place] Heart Association class I at this time I would not advise an ischemia evaluation Heart failure is compensated no edema currently not on a loop diuretic we will recheck his renal function in 1 week Continue statin check lipid profile in a week .   Next appointment: 6 months   Medication Adjustments/Labs and Tests Ordered: Current medicines are reviewed at length with the patient today.  Concerns regarding medicines are outlined above.  No orders of the defined types were placed in this encounter.  No orders of the defined types were placed in this encounter.   No chief complaint on file.   History of Present Illness:    Nathaniel Leach is a 80 y.o. 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 Echocardiogram 07/03/2019 showed a low normal ejection fraction 50 to 25% normal diastolic filling pressure normal right ventricular function and mild enlargement of the ascending aorta.    Compliance with diet, lifestyle and medications: Yes  [facility] digoxin level therapeutic 0.9 BNP 280 TSH normal 1.81  He continues to follow with the [facility] hospital he has long COVID symptoms for years fatigue shortness of breath and loss of appetite.  He is lost a significant amount of weight.  He is on digoxin he is not having nausea or vomiting.  Reviewed symptoms of dose and check a level in 1 week he short of breath with more than usual activities no edema orthopnea chest pain palpitation or syncope he continues his anticoagulant without bleeding complication statin without muscle pain or weakness Past Medical History:  Diagnosis Date   Acute recurrent maxillary sinusitis 03/30/2019   Anosmia 02/18/2019   APC (atrial premature contractions) 08/28/2016   Arrhythmia    CHF (congestive heart failure) (HCC)    Chronic anticoagulation 08/31/2014   Chronic diastolic heart failure ([place]) 08/31/2014   Chronic ethmoidal sinusitis 04/28/2019   Chronic kidney disease    CKD (chronic kidney  disease) stage 3, GFR 30-59 ml/min (HCC) 11/23/2016   Coronary artery disease involving native coronary artery of native heart 08/31/2014   Dyslipidemia 08/28/2016   Hyperlipidemia    Hypertension    Hypertensive heart and chronic kidney disease with heart failure and stage 1 through stage 4 chronic kidney disease, or unspecified chronic kidney disease ([place]) 08/31/2014    Microalbuminuria 05/02/2017   Nasal septal deviation 02/18/2019   On amiodarone therapy 08/31/2014   PAF (paroxysmal atrial fibrillation) ([place]) 08/31/2014   Overview:  rapid rate requiring beta blocker , CCB and amiodorone for rate control   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   atorvastatin (LIPITOR) 80 MG tablet Take 80 mg by mouth daily.    citalopram (CELEXA) 20 MG tablet Take 10 mg by mouth daily.   digoxin (LANOXIN) 0.125 MG tablet Take 1 tablet (0.125 mg total) by mouth daily.   empagliflozin (JARDIANCE) 25 MG TABS tablet Take 12.5 mg by mouth in the morning.   glipiZIDE (GLUCOTROL) 5 MG tablet Take 2.5 mg by mouth daily.   guaiFENesin (MUCINEX) 600 MG 12 hr tablet Take 600 mg by mouth 2 (two) times daily.   ipratropium (ATROVENT HFA) 17 MCG/ACT inhaler Inhale 2 puffs into the lungs 2 (two) times daily.   metoprolol tartrate (LOPRESSOR) 100 MG tablet Take 100 mg by mouth 2 (two) times daily.   Multiple Vitamin (MULTIVITAMIN) capsule Take 1 capsule by mouth daily.    Omega-3 Fatty Acids (FISH OIL PO) Take by mouth.   omeprazole (PRILOSEC) 20 MG capsule Take 20 mg by mouth daily.   sodium chloride HYPERTONIC 3 % nebulizer solution Take by nebulization daily. Dx J47.9   Tiotropium Bromide-Olodaterol (STIOLTO RESPIMAT) 2.5-2.5 MCG/ACT AERS Inhale 2 puffs into the lungs daily.   vitamin E 400 UNIT capsule Take 400 Units by mouth daily.     Allergies:   Sertraline   Social History   Socioeconomic History   Marital status: Married    Spouse name: Not on file   Number of children: Not on file   Years of  education: Not on file   Highest education level: Not on file  Occupational History   Not on file  Tobacco Use   Smoking status: Former    Packs/day: 1.50    Years: 20.00    Pack years: 30.00    Types: Cigarettes   Smokeless tobacco: Former    Types: Chew   Tobacco comments:    Quit in [place] Use   Vaping Use: Never used  Substance and Sexual Activity   Alcohol use: Not Currently   Drug use: Never   Sexual activity: Not on file  Other Topics Concern   Not on file  Social History Narrative   Not on file   Social Determinants of Health   Financial Resource Strain: Not on file  Food Insecurity: Not on file  Transportation Needs: Not on file  Physical Activity: Not on file  Stress: Not on file  Social Connections: Not  on file     Family History: The patient's family history includes CAD in his father; Diabetes in his brother; Stroke in his mother. ROS:   Please see the history of present illness.    All other systems reviewed and are negative.  EKGs/Labs/Other Studies Reviewed:    The following studies were reviewed today:  EKG:  EKG ordered today and personally reviewed.  The ekg ordered today demonstrates atrial fibrillation rate 111 bpm  Recent Labs: No results found for requested labs within last 8760 hours.  Recent Lipid Panel    Component Value Date/Time   CHOL 128 10/08/2016 1110   TRIG 258 (H) 10/08/2016 1110   HDL 36 (L) 10/08/2016 1110   CHOLHDL 3.6 10/08/2016 1110   LDLCALC 40 10/08/2016 1110    Physical Exam:    VS:  BP 130/64   Pulse (!) 111   Wt 170 lb 9.6 oz (77.4 kg)   SpO2 94%   BMI 23.79 kg/m     Wt Readings from Last 3 Encounters:  12/14/20 170 lb 9.6 oz (77.4 kg)  09/30/20 171 lb 12.8 oz (77.9 kg)  07/13/20 175 lb (79.4 kg)     GEN: He looks improved from previous visits well nourished, well developed in no acute distress HEENT: Normal NECK: No JVD; No carotid bruits LYMPHATICS: No lymphadenopathy CARDIAC: Irregular  rate and rhythm no murmurs, rubs, gallops RESPIRATORY:  Clear to auscultation without rales, wheezing or rhonchi  ABDOMEN: Soft, non-tender, non-distended MUSCULOSKELETAL:  No edema; No deformity  SKIN: Warm and dry NEUROLOGIC:  Alert and oriented x 3 PSYCHIATRIC:  Normal affect    Signed, Shirlee More, MD  12/14/2020 1:46 PM    [facility]

## 2020-12-14 ENCOUNTER — Other Ambulatory Visit: Payer: Self-pay

## 2020-12-14 ENCOUNTER — Encounter: Payer: Self-pay | Admitting: Cardiology

## 2020-12-14 ENCOUNTER — Ambulatory Visit (INDEPENDENT_AMBULATORY_CARE_PROVIDER_SITE_OTHER): Payer: Medicare Other | Admitting: Cardiology

## 2020-12-14 VITALS — BP 130/64 | HR 111 | Wt 170.6 lb

## 2020-12-14 DIAGNOSIS — I4819 Other persistent atrial fibrillation: Secondary | ICD-10-CM | POA: Diagnosis not present

## 2020-12-14 DIAGNOSIS — I130 Hypertensive heart and chronic kidney disease with heart failure and stage 1 through stage 4 chronic kidney disease, or unspecified chronic kidney disease: Secondary | ICD-10-CM

## 2020-12-14 DIAGNOSIS — Z7901 Long term (current) use of anticoagulants: Secondary | ICD-10-CM

## 2020-12-14 DIAGNOSIS — K219 Gastro-esophageal reflux disease without esophagitis: Secondary | ICD-10-CM | POA: Insufficient documentation

## 2020-12-14 DIAGNOSIS — C44311 Basal cell carcinoma of skin of nose: Secondary | ICD-10-CM | POA: Insufficient documentation

## 2020-12-14 DIAGNOSIS — F32A Depression, unspecified: Secondary | ICD-10-CM | POA: Insufficient documentation

## 2020-12-14 DIAGNOSIS — E1165 Type 2 diabetes mellitus with hyperglycemia: Secondary | ICD-10-CM | POA: Insufficient documentation

## 2020-12-14 DIAGNOSIS — E785 Hyperlipidemia, unspecified: Secondary | ICD-10-CM | POA: Diagnosis not present

## 2020-12-14 DIAGNOSIS — E119 Type 2 diabetes mellitus without complications: Secondary | ICD-10-CM | POA: Insufficient documentation

## 2020-12-14 DIAGNOSIS — I2510 Atherosclerotic heart disease of native coronary artery without angina pectoris: Secondary | ICD-10-CM

## 2020-12-14 DIAGNOSIS — Z79899 Other long term (current) drug therapy: Secondary | ICD-10-CM | POA: Diagnosis not present

## 2020-12-14 DIAGNOSIS — Z951 Presence of aortocoronary bypass graft: Secondary | ICD-10-CM | POA: Diagnosis not present

## 2020-12-14 DIAGNOSIS — D631 Anemia in chronic kidney disease: Secondary | ICD-10-CM | POA: Insufficient documentation

## 2020-12-14 DIAGNOSIS — I890 Lymphedema, not elsewhere classified: Secondary | ICD-10-CM | POA: Insufficient documentation

## 2020-12-14 DIAGNOSIS — F411 Generalized anxiety disorder: Secondary | ICD-10-CM | POA: Insufficient documentation

## 2020-12-14 MED ORDER — DIGOXIN 125 MCG PO TABS
0.1250 mg | ORAL_TABLET | ORAL | 3 refills | Status: DC
Start: 2020-12-14 — End: 2022-07-10

## 2020-12-14 NOTE — Patient Instructions (Signed)
Medication Instructions:  Your physician has recommended you make the following change in your medication:  REDUCE: Digoxin take only on Monday, Wednesday, and Friday *If you need a refill on your cardiac medications before your next appointment, please call your pharmacy*   Lab Work: Your physician recommends that you return for lab work in: 1 week BMP, Digoxin, Lipids If you have labs (blood work) drawn today and your tests are completely normal, you will receive your results only by: [place] (if you have MyChart) OR A paper copy in the mail If you have any lab test that is abnormal or we need to change your treatment, we will call you to review the results.   Testing/Procedures: None   Follow-Up: At [facility], you and your health needs are our priority.  As part of our continuing mission to provide you with exceptional heart care, we have created designated Provider Care Teams.  These Care Teams include your primary Cardiologist (physician) and Advanced Practice Providers (APPs -  Physician Assistants and Nurse Practitioners) who all work together to provide you with the care you need, when you need it.  We recommend signing up for the patient portal called "MyChart".  Sign up information is provided on this After Visit Summary.  MyChart is used to connect with patients for Virtual Visits (Telemedicine).  Patients are able to view lab/test results, encounter notes, upcoming appointments, etc.  Non-urgent messages can be sent to your provider as well.   To learn more about what you can do with MyChart, go to [email or web address].    Your next appointment:   6 month(s)  The format for your next appointment:   In Person  Provider:   Shirlee More, MD   Other Instructions

## 2020-12-20 DIAGNOSIS — I4819 Other persistent atrial fibrillation: Secondary | ICD-10-CM | POA: Diagnosis not present

## 2020-12-20 DIAGNOSIS — Z951 Presence of aortocoronary bypass graft: Secondary | ICD-10-CM | POA: Diagnosis not present

## 2020-12-20 DIAGNOSIS — I2510 Atherosclerotic heart disease of native coronary artery without angina pectoris: Secondary | ICD-10-CM | POA: Diagnosis not present

## 2020-12-20 DIAGNOSIS — E785 Hyperlipidemia, unspecified: Secondary | ICD-10-CM | POA: Diagnosis not present

## 2020-12-20 DIAGNOSIS — Z7901 Long term (current) use of anticoagulants: Secondary | ICD-10-CM | POA: Diagnosis not present

## 2020-12-20 DIAGNOSIS — I130 Hypertensive heart and chronic kidney disease with heart failure and stage 1 through stage 4 chronic kidney disease, or unspecified chronic kidney disease: Secondary | ICD-10-CM | POA: Diagnosis not present

## 2020-12-20 DIAGNOSIS — Z79899 Other long term (current) drug therapy: Secondary | ICD-10-CM | POA: Diagnosis not present

## 2020-12-21 ENCOUNTER — Telehealth: Payer: Self-pay | Admitting: Cardiology

## 2020-12-21 LAB — LIPID PANEL
Chol/HDL Ratio: 2.4 ratio (ref 0.0–5.0)
Cholesterol, Total: 122 mg/dL (ref 100–199)
HDL: 51 mg/dL (ref >39)
LDL Chol Calc (NIH): 49 mg/dL (ref 0–99)
Triglycerides: 125 mg/dL (ref 0–149)
VLDL Cholesterol Cal: 22 mg/dL (ref 5–40)

## 2020-12-21 LAB — BASIC METABOLIC PANEL
BUN/Creatinine Ratio: 9 — ABNORMAL LOW (ref 10–24)
BUN: 9 mg/dL (ref 8–27)
CO2: 24 mmol/L (ref 20–29)
Calcium: 9 mg/dL (ref 8.6–10.2)
Chloride: 103 mmol/L (ref 96–106)
Creatinine, Ser: 1.01 mg/dL (ref 0.76–1.27)
Glucose: 96 mg/dL (ref 70–99)
Potassium: 4.5 mmol/L (ref 3.5–5.2)
Sodium: 141 mmol/L (ref 134–144)
eGFR: 75 mL/min/{1.73_m2} (ref >59)

## 2020-12-21 LAB — DIGOXIN LEVEL: Digoxin, Serum: 0.4 ng/mL — ABNORMAL LOW (ref 0.5–0.9)

## 2020-12-21 NOTE — Telephone Encounter (Signed)
Patient informed of results.  

## 2020-12-21 NOTE — Telephone Encounter (Signed)
Patient was returning for results

## 2021-01-11 DIAGNOSIS — Z23 Encounter for immunization: Secondary | ICD-10-CM | POA: Diagnosis not present

## 2021-01-11 DIAGNOSIS — L02511 Cutaneous abscess of right hand: Secondary | ICD-10-CM | POA: Diagnosis not present

## 2021-01-23 IMAGING — DX DG CHEST 2V
2 series · 2 of 2 positions shown · non-contrast
Comparison: November 11, 2019

CLINICAL DATA: Follow-up pneumonia.

EXAM:
CHEST - 2 VIEW

[chest pa]
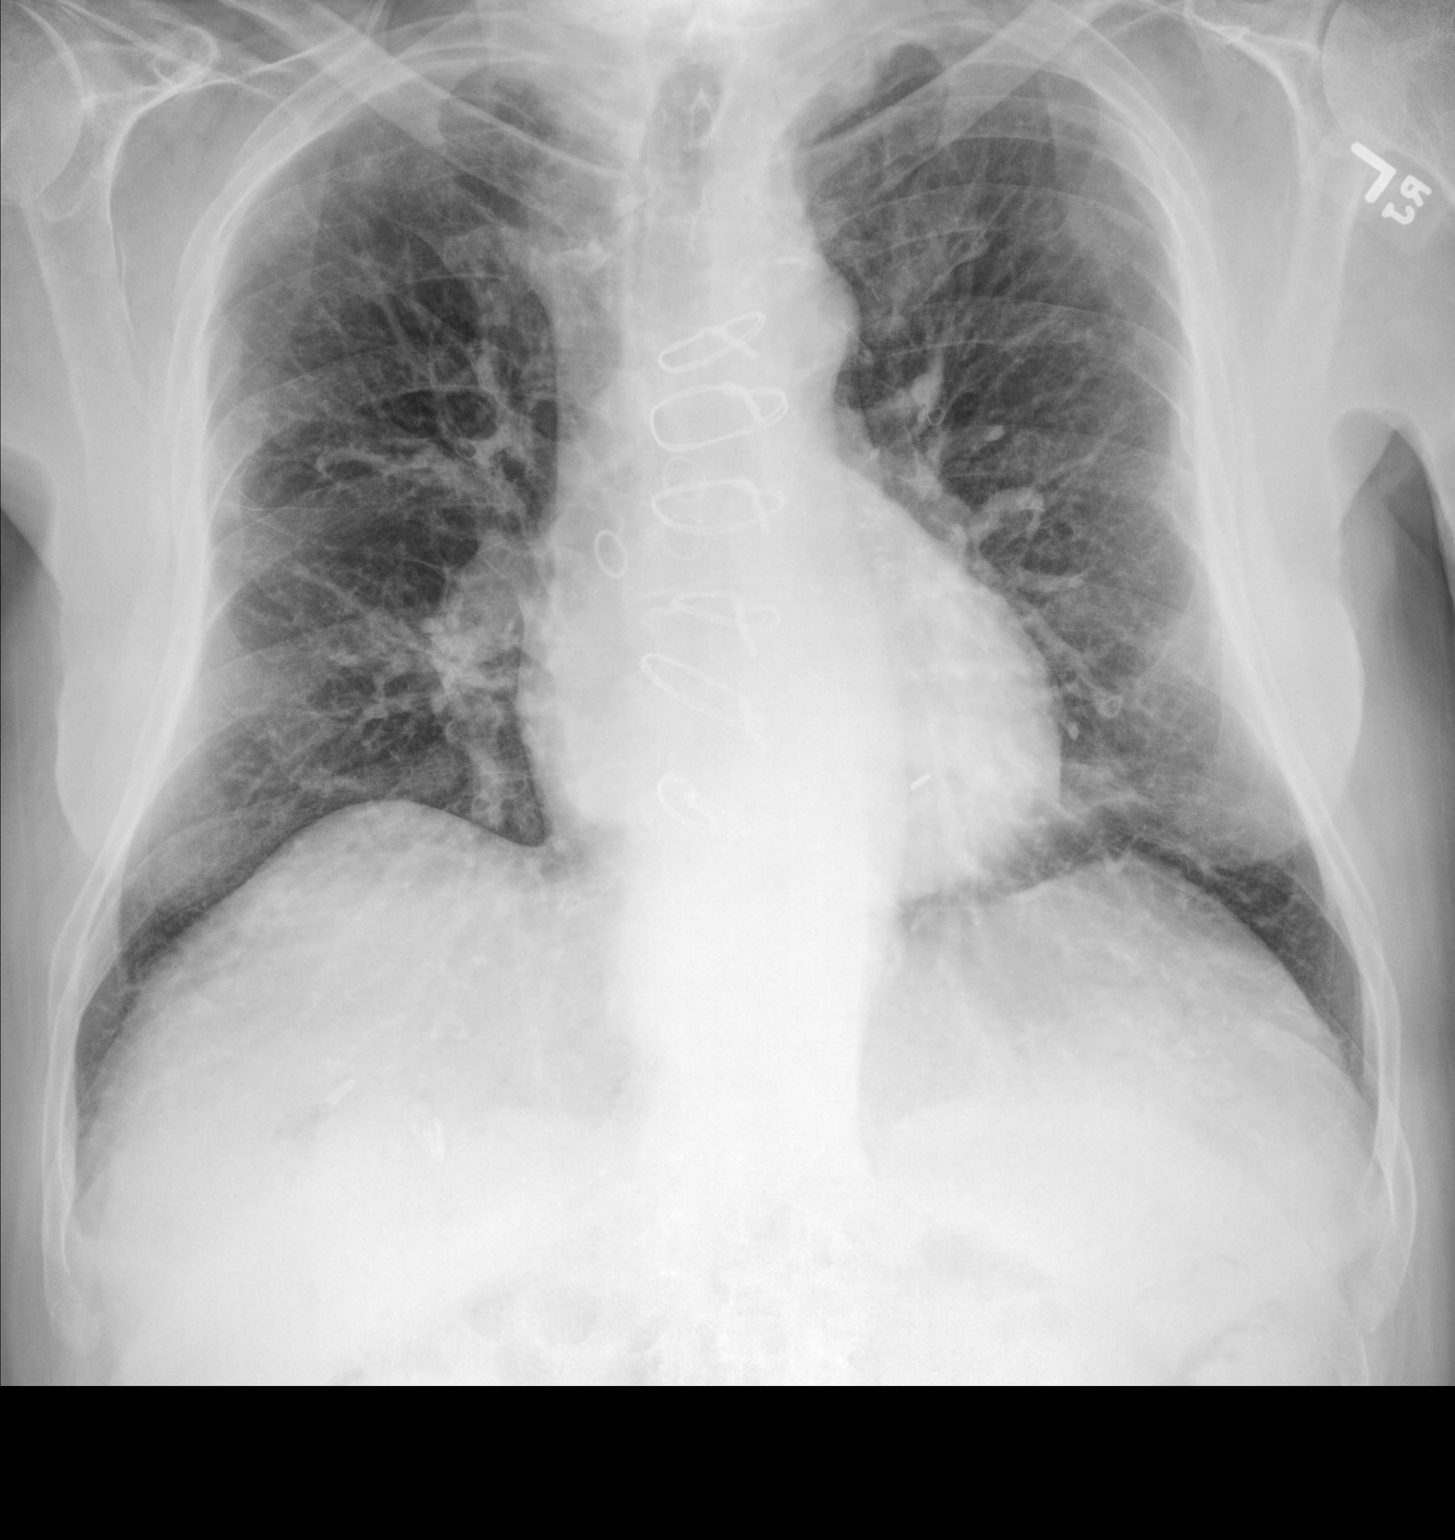

[chest lat]
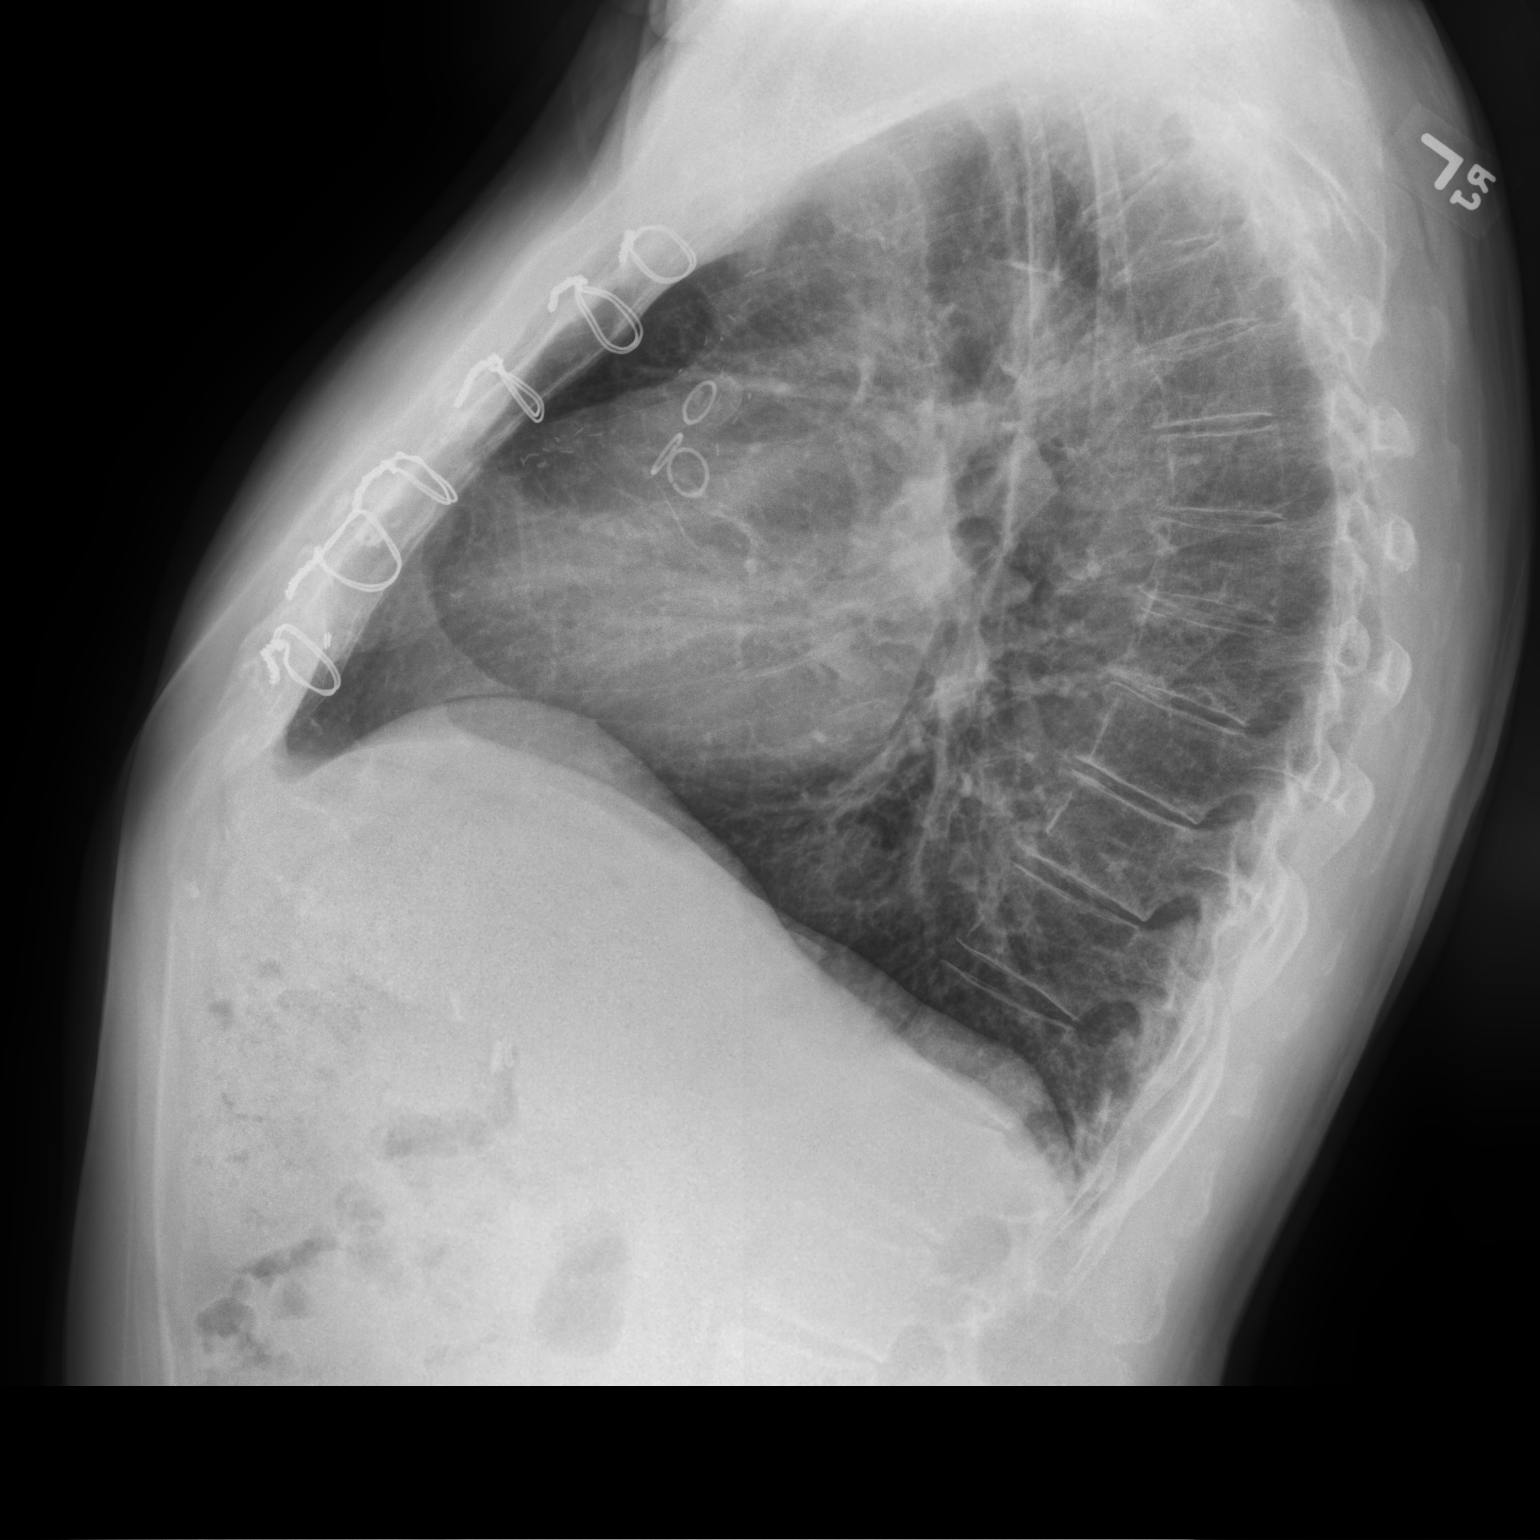

[2 of 2 positions shown; findings below may reference images not displayed]

FINDINGS: Multiple sternal wires are seen. Predominant stable moderate
severity diffusely increased interstitial lung markings are noted.
There is no evidence of focal consolidation, pleural effusion or
pneumothorax. The heart size and mediastinal contours are within
normal limits. Radiopaque surgical clips are seen overlying the
right upper quadrant. Multiple chronic left-sided rib fractures are
seen. Multilevel degenerative changes seen throughout the thoracic
spine.
IMPRESSION: 1. Stable moderate severity diffuse interstitial infiltrates, likely
chronic in nature.
2. Evidence of prior median sternotomy/CABG.

## 2021-01-25 DIAGNOSIS — L02413 Cutaneous abscess of right upper limb: Secondary | ICD-10-CM | POA: Diagnosis not present

## 2021-01-25 DIAGNOSIS — L02414 Cutaneous abscess of left upper limb: Secondary | ICD-10-CM | POA: Diagnosis not present

## 2021-01-25 DIAGNOSIS — R531 Weakness: Secondary | ICD-10-CM | POA: Diagnosis not present

## 2021-02-07 ENCOUNTER — Ambulatory Visit (INDEPENDENT_AMBULATORY_CARE_PROVIDER_SITE_OTHER): Payer: Medicare Other | Admitting: Pulmonary Disease

## 2021-02-07 ENCOUNTER — Encounter: Payer: Self-pay | Admitting: Pulmonary Disease

## 2021-02-07 ENCOUNTER — Other Ambulatory Visit: Payer: Self-pay

## 2021-02-07 VITALS — BP 116/74 | HR 85 | Temp 97.7°F | Ht 71.0 in | Wt 170.6 lb

## 2021-02-07 DIAGNOSIS — I2510 Atherosclerotic heart disease of native coronary artery without angina pectoris: Secondary | ICD-10-CM

## 2021-02-07 DIAGNOSIS — J471 Bronchiectasis with (acute) exacerbation: Secondary | ICD-10-CM | POA: Diagnosis not present

## 2021-02-07 MED ORDER — LEVOFLOXACIN 500 MG PO TABS
500.0000 mg | ORAL_TABLET | Freq: Every day | ORAL | 0 refills | Status: AC
Start: 2021-02-07 — End: 2021-02-14

## 2021-02-07 MED ORDER — ALBUTEROL SULFATE (2.5 MG/3ML) 0.083% IN NEBU: 2.5000 mg | INHALATION_SOLUTION | Freq: Four times a day (QID) | RESPIRATORY_TRACT | 12 refills | Status: DC | PRN

## 2021-02-07 NOTE — Patient Instructions (Addendum)
°  X Rx Levaquin 500 milligrams daily for 7 days  X Refill on saline nebs & albuterol nebs every 6h  Continue on stiolto   We discussed vest

## 2021-02-07 NOTE — Progress Notes (Signed)
° °  Subjective:    Patient ID: Nathaniel Leach, male    DOB: 29-Dec-1940, 80 y.o.   MRN: [id number]  HPI  80 yo remote smoker for follow-up of bronchiectasis and   PMH - -Moderate hiatal hernia -Episode of bilateral lower lobe pneumonia 07/2019, Pseudomonas pneumonia 10/2019 -new onset A. fib  on anticoagulation   08/2019 sputum culture positive for Pseudomonas sensitive to Cipro/levofloxacin  PFT showed airway obstruction but this reversed on follow-up spirometry.  79-month follow-up visit, he complains of chest tightness and uses saline nebs twice daily.  He is compliant with flutter valve.  He reports yellow sputum always he reports shortness of breath with walking.  Sputum amount is not more than usual. 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  Sputum culture 11/16/2019 + for Pseudomonas-pansensitive HRCT 09/2019 > Mild centrilobular and paraseptal emphysema with prominent diffuse bronchial wall thickening, suggesting COPD. 2. Mild diffuse cylindrical bronchiectasis with moderate patchy tree-in-bud opacities most prominent in the dependent lower lobes. Findings suggest chronic/recurrent bronchiolitis such as due to recurrent aspiration or atypical mycobacterial infection (MAI). 3. Resolved bilateral lower lobe pneumonia seen on 08/26/2019    PFTs 10/2019 moderate airway obstruction, ratio 57, FEV1 1.39/48%, FVC 60%, no bronchodilator response, and DLCO 69%, TLC normal   CT angiogram chest 08/26/2019 patchy predominantly lower lobe opacities bilateral   CT chest with contrast 05/2019 moderate hiatal hernia small clustered nodules left lower lobe, mild scarring left upper lobe and lingula, mild bronchiectasis  to my review   CT chest/abdomen/pelvis 04/2013 clear lungs   Review of Systems neg for any significant sore throat, dysphagia, itching, sneezing, nasal congestion or excess/ purulent secretions, fever, chills, sweats, unintended wt loss, pleuritic or exertional cp, hempoptysis, orthopnea pnd or change in chronic leg swelling. Also denies presyncope, palpitations, heartburn, abdominal pain, nausea, vomiting, diarrhea or change in bowel or urinary habits, dysuria,hematuria, rash, arthralgias, visual complaints, headache, numbness weakness or ataxia.     Objective:   Physical Exam  Gen. Pleasant, elderly,well-nourished, in no distress ENT - no thrush, no pallor/icterus,no post nasal drip Neck: No JVD, no thyromegaly, no carotid bruits Lungs: no use of accessory muscles, no dullness to percussion, clear without rales or rhonchi  Cardiovascular: Rhythm regular, heart sounds  normal, no murmurs or gallops, no peripheral edema Musculoskeletal: No deformities, no cyanosis or clubbing        Assessment & Plan:   Bronchiectasis with exacerbation -we will treat him with a course of Levaquin for 7 days given his prior culture showing Pseudomonas. -Also emphasized airway clearance with hypertonic saline nebs twice daily, flutter valve. -We will give him prescription for albuterol nebs to use twice daily as needed. -He would qualify for vest therapy but would like to hold off unless absolutely necessary.  He will continue on Stiolto , I did not feel the need to add inhaled steroid currently.  His airway obstruction was reversible on his last spirometry which seems to suggest that the main issue here would be airway clearance

## 2021-02-13 ENCOUNTER — Telehealth: Payer: Self-pay | Admitting: Pulmonary Disease

## 2021-02-13 MED ORDER — SODIUM CHLORIDE 3 % IN NEBU: INHALATION_SOLUTION | Freq: Every day | RESPIRATORY_TRACT | 5 refills | Status: DC

## 2021-02-13 NOTE — Telephone Encounter (Signed)
Refill of pt's pt's sodium chloride sol has been sent to preferred pharmacy for pt. Called and spoke with pt letting him know this had been done and he verbalized understanding. Nothing further needed.

## 2021-02-27 IMAGING — DX DG CHEST 2V
2 series · 2 of 2 positions shown · non-contrast
Comparison: 11/25/2019

CLINICAL DATA: Bronchiectasis

EXAM:
CHEST - 2 VIEW

[chest pa]
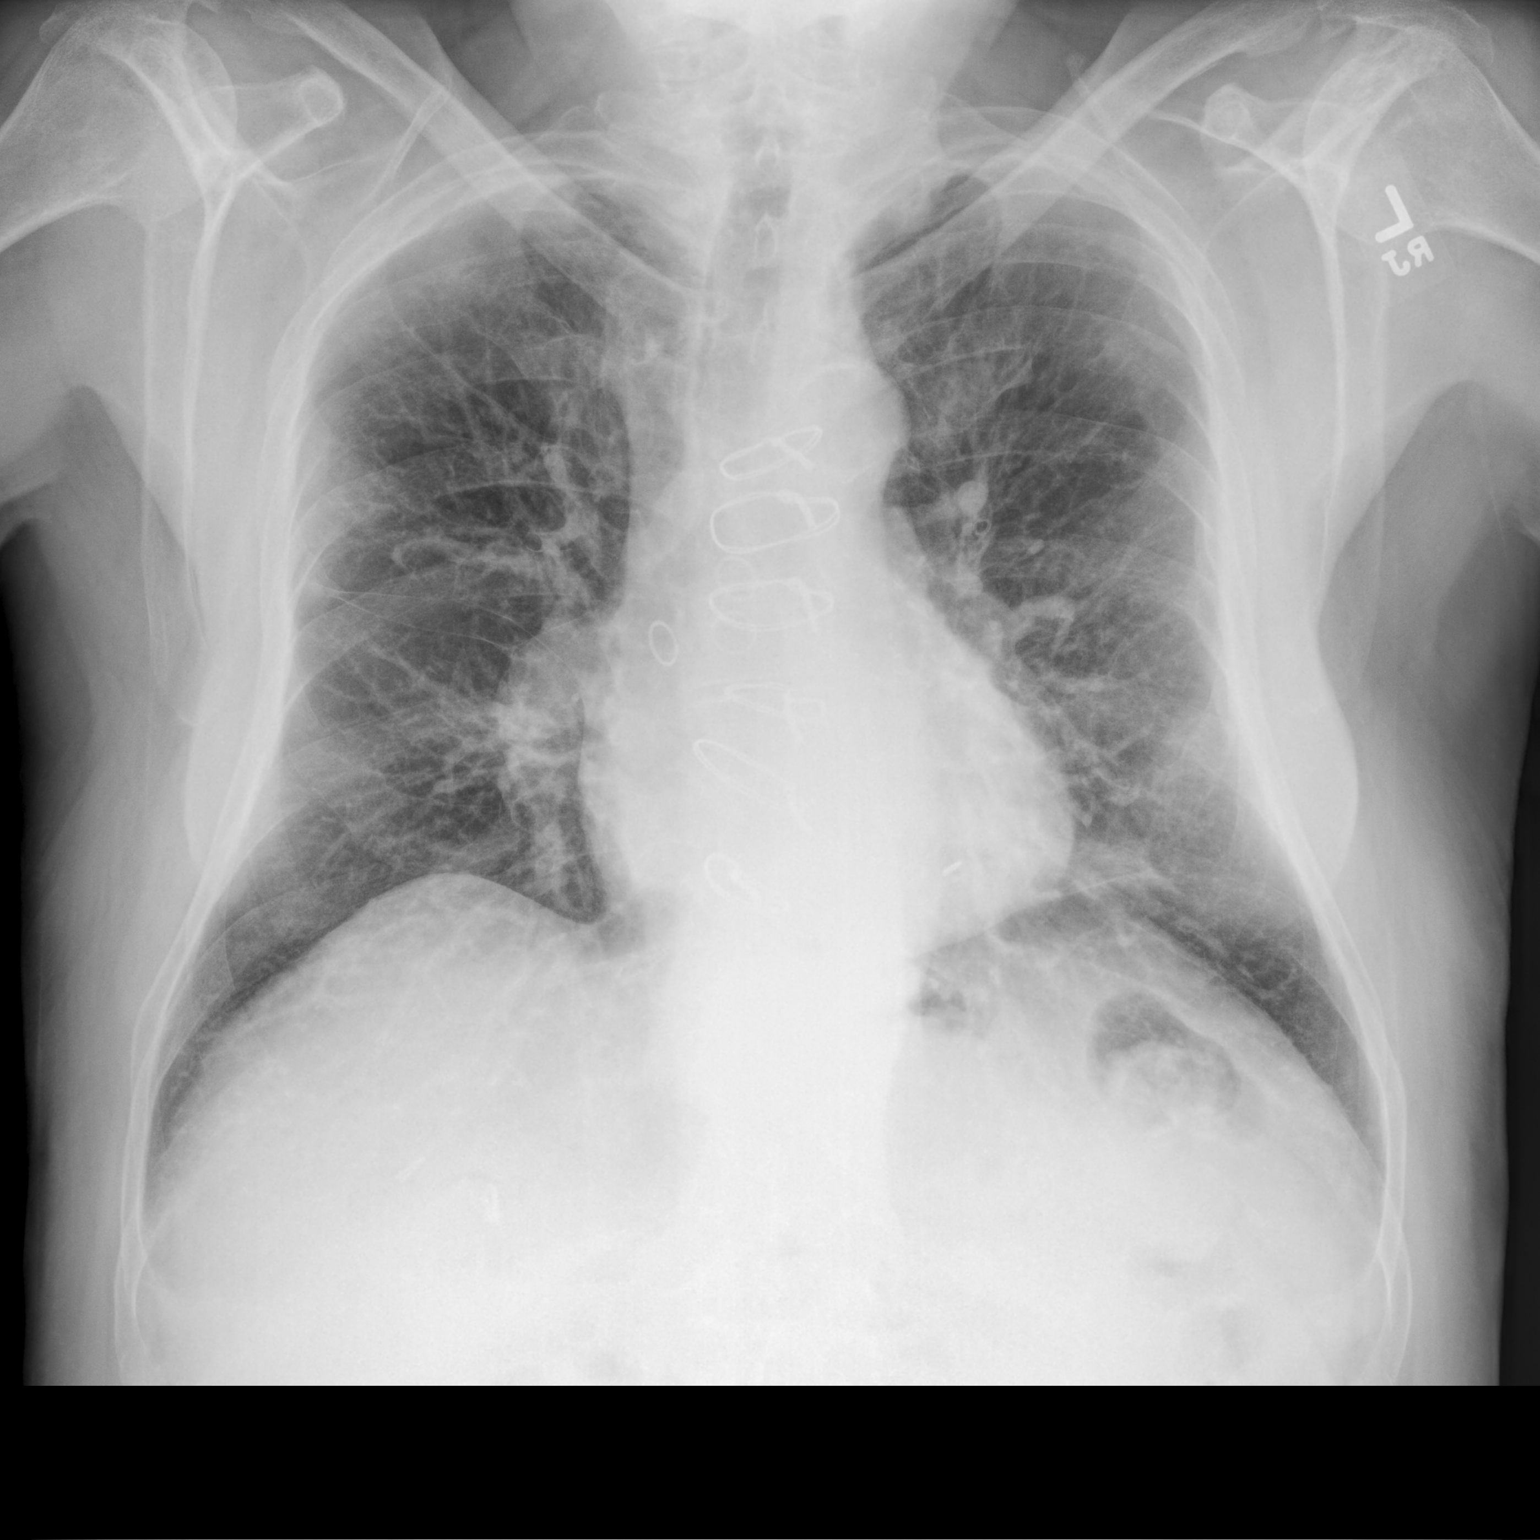

[chest lat]
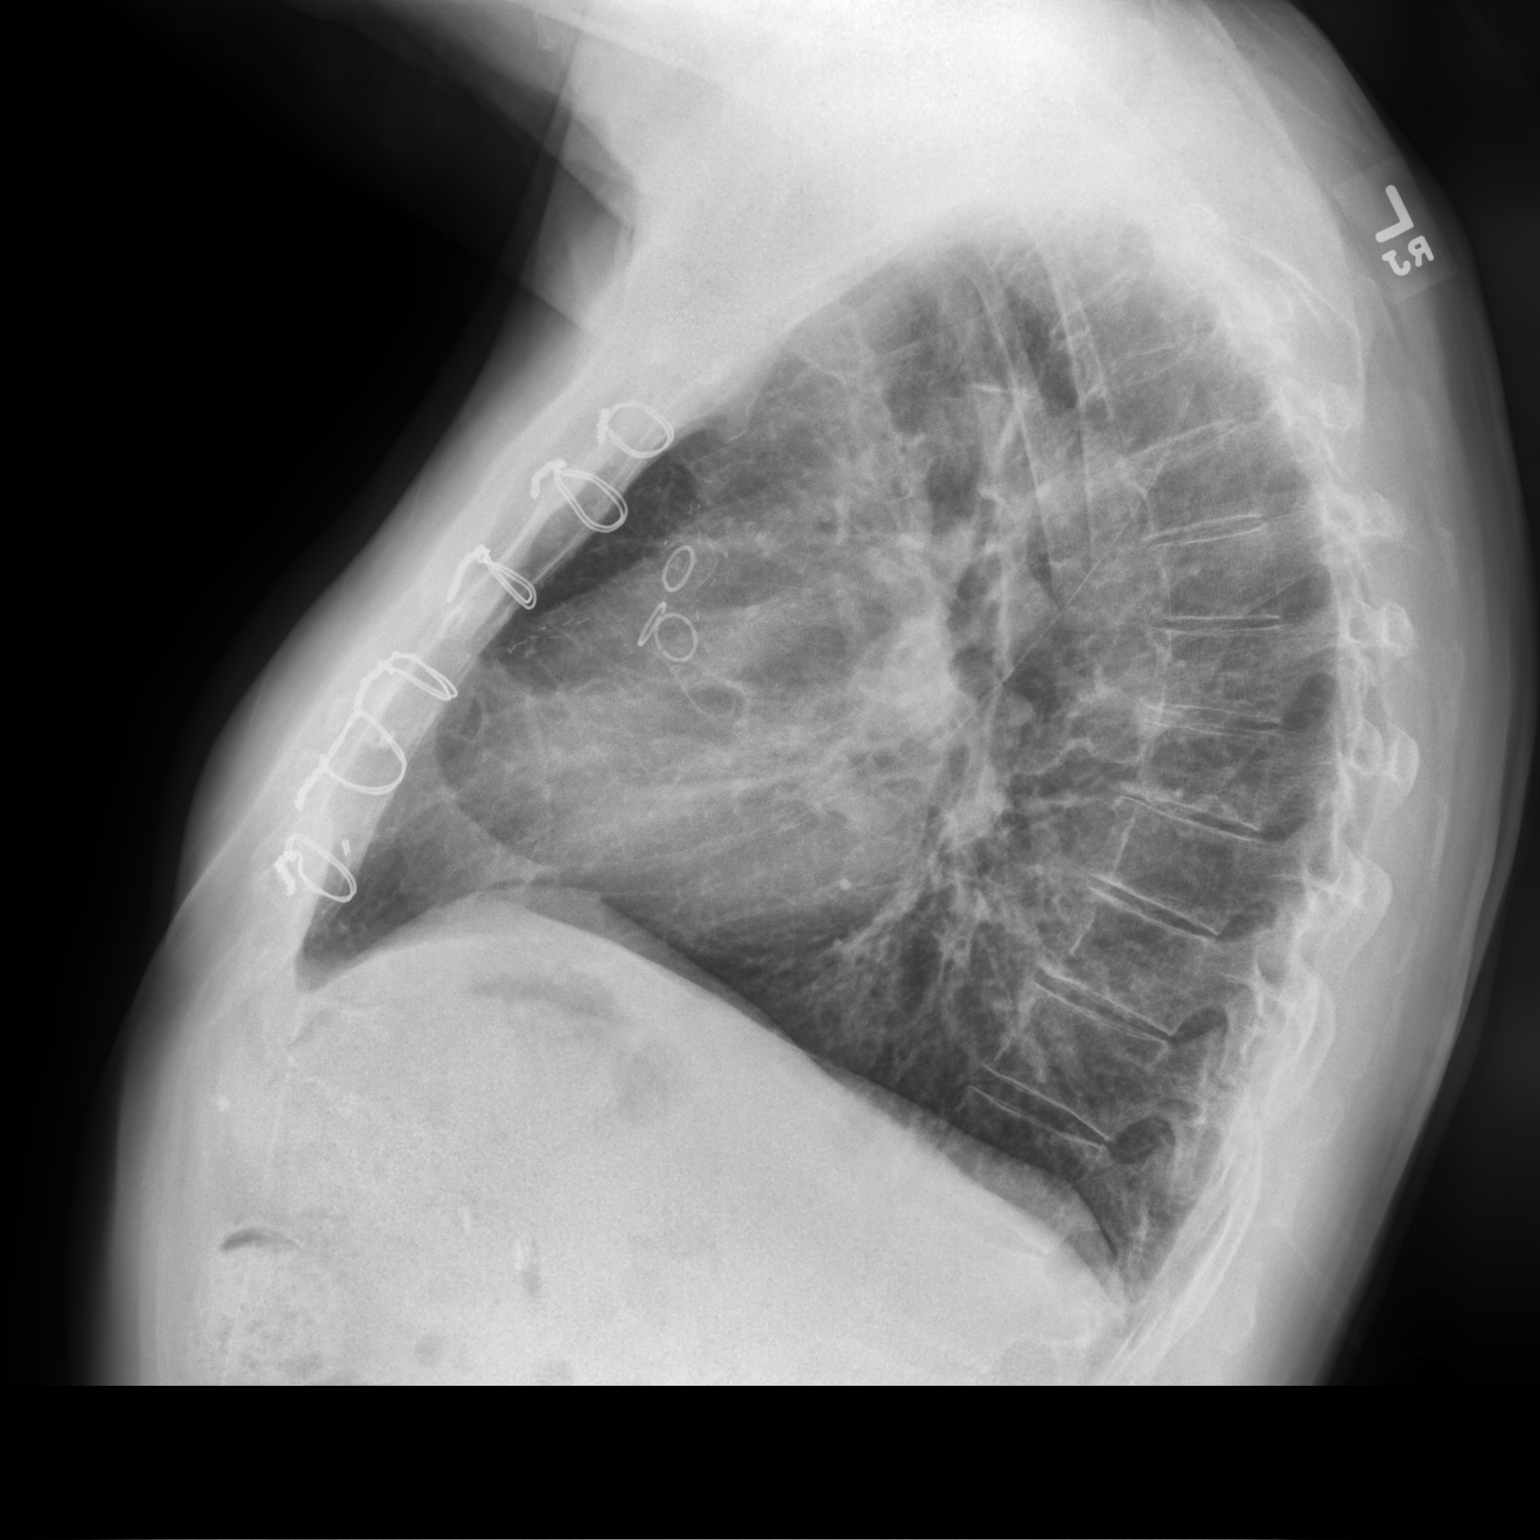

[2 of 2 positions shown; findings below may reference images not displayed]

FINDINGS: Chronic interstitial prominence with cylindrical bronchiectasis. No
new consolidation or edema. No pleural effusion. Stable
cardiomediastinal contours with normal heart size.
IMPRESSION: No substantial change since 11/25/2019. Chronic interstitial changes
with cylindrical bronchiectasis.

## 2021-03-08 DIAGNOSIS — L219 Seborrheic dermatitis, unspecified: Secondary | ICD-10-CM | POA: Diagnosis not present

## 2021-03-08 DIAGNOSIS — L300 Nummular dermatitis: Secondary | ICD-10-CM | POA: Diagnosis not present

## 2021-03-21 DIAGNOSIS — E113292 Type 2 diabetes mellitus with mild nonproliferative diabetic retinopathy without macular edema, left eye: Secondary | ICD-10-CM | POA: Diagnosis not present

## 2021-04-14 ENCOUNTER — Telehealth: Payer: Self-pay | Admitting: Pulmonary Disease

## 2021-04-17 NOTE — Telephone Encounter (Signed)
Called and spoke with patient and he stated that he wanted to try the compressor vest that was discussed in the last office visit with Dr Elsworth Soho. Told patient I would message dr Elsworth Soho to make sure that this is still ok to order.   Dr Elsworth Soho please advise

## 2021-04-19 ENCOUNTER — Other Ambulatory Visit: Payer: Self-pay

## 2021-04-19 DIAGNOSIS — J439 Emphysema, unspecified: Secondary | ICD-10-CM

## 2021-04-19 NOTE — Telephone Encounter (Signed)
Placed order for [facility] for patient. Informed patient of the order. Nothing further needed at this time

## 2021-04-24 ENCOUNTER — Telehealth: Payer: Self-pay | Admitting: Pulmonary Disease

## 2021-04-24 DIAGNOSIS — H1131 Conjunctival hemorrhage, right eye: Secondary | ICD-10-CM | POA: Diagnosis not present

## 2021-04-24 NOTE — Telephone Encounter (Signed)
Dr. Elsworth Soho, please advise if you are ok with adding the below documentation to your last OV.   "Patient has has daily productive cough lasting greater than 6 months requiring antibiotic therapy.  Flutter valve has been tried, but has not effectively mobilized secretions.  Considered and ruled out manual CPT as no consistent skilled caregiver available to perform therapy.  CT scan performed on 10/14/2019 confirms bronchiectasis.  Starting patient on vest therapy."

## 2021-04-25 DIAGNOSIS — I4891 Unspecified atrial fibrillation: Secondary | ICD-10-CM | POA: Diagnosis not present

## 2021-04-25 DIAGNOSIS — E785 Hyperlipidemia, unspecified: Secondary | ICD-10-CM | POA: Diagnosis not present

## 2021-04-25 DIAGNOSIS — E1165 Type 2 diabetes mellitus with hyperglycemia: Secondary | ICD-10-CM | POA: Diagnosis not present

## 2021-04-26 NOTE — Telephone Encounter (Signed)
Rigoberto Noel, MD  [facility] Triage Pool 2 days ago  ? ?I do not want this Rx to go to DME .  ?Please provide me with the Rx order form for smart vest.  ?I feel they provide better service to the patient than DME .  ?(Can place in my look-at with pt details filled out)  ?thanks   ? ? ?Form has been filled out for the smart vest and has been placed with Dr. Bari Mantis papers. ? ?An order had been placed by April, RN 04/19/21 instead of the form being filled out. Sent message to [facility] to have that DME order cancelled. Nothing further needed. ?

## 2021-05-08 ENCOUNTER — Ambulatory Visit (INDEPENDENT_AMBULATORY_CARE_PROVIDER_SITE_OTHER): Payer: Medicare Other | Admitting: Pulmonary Disease

## 2021-05-08 ENCOUNTER — Encounter: Payer: Self-pay | Admitting: Pulmonary Disease

## 2021-05-08 ENCOUNTER — Other Ambulatory Visit: Payer: Self-pay

## 2021-05-08 VITALS — BP 120/66 | HR 97 | Temp 97.7°F | Ht 71.0 in | Wt 178.2 lb

## 2021-05-08 DIAGNOSIS — J471 Bronchiectasis with (acute) exacerbation: Secondary | ICD-10-CM | POA: Diagnosis not present

## 2021-05-08 DIAGNOSIS — J441 Chronic obstructive pulmonary disease with (acute) exacerbation: Secondary | ICD-10-CM | POA: Diagnosis not present

## 2021-05-08 DIAGNOSIS — J479 Bronchiectasis, uncomplicated: Secondary | ICD-10-CM | POA: Diagnosis not present

## 2021-05-08 MED ORDER — TRELEGY ELLIPTA 100-62.5-25 MCG/ACT IN AEPB: 1.0000 | INHALATION_SPRAY | Freq: Every day | RESPIRATORY_TRACT | 0 refills | Status: DC

## 2021-05-08 NOTE — Patient Instructions (Addendum)
?  Rx : [employer]  [phone] for Smart vest has been sent ? ?Continue saline nebs twice daily ? ?X Sample of trelegy 100 once daily instead of stiolto ? ?If no better , call me back ? ?X refill on albuterol ? ?X sputum for fungal / afb ?

## 2021-05-08 NOTE — Assessment & Plan Note (Signed)
Technically reversible airway obstruction which is more consistent with asthma.  We will change him from [facility] to Trelegy ?He would prefer to avoid a course of prednisone unless necessary ?We may also consider a course of Levaquin again if sputum culture again shows Pseudomonas ?

## 2021-05-08 NOTE — Progress Notes (Signed)
t ? ?Subjective:  ? ? Patient ID: Nathaniel Leach, male    DOB: 1940-09-11, 81 y.o.   MRN: [id number] ? ?HPI ? ?81 yo remote smoker for follow-up of bronchiectasis and asthma ?08/2019 sputum culture positive for Pseudomonas sensitive to Cipro/levofloxacin  ?PFT showed airway obstruction >> reversed on follow-up spirometry. ?  ?PMH - ?-Moderate hiatal hernia ?-Episode of bilateral lower lobe pneumonia 07/2019, Pseudomonas pneumonia 10/2019 ?-new onset A. fib  on anticoagulation ?  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 ? ? ?Significant tests/ events reviewed ? ?Spirometry 12/2019 >> no airway obstruction, ratio 73, FEV1 85% ?  ?Sputum AFB 11/16/2019  Neg .  ?Sputum culture 11/16/2019 + for Pseudomonas-pansensitive ?HRCT 09/2019 > Mild centrilobular and paraseptal emphysema with prominent diffuse bronchial wall thickening, suggesting COPD. ?2. Mild diffuse cylindrical bronchiectasis with moderate patchy tree-in-bud opacities most prominent in the dependent lower lobes. ?Findings suggest chronic/recurrent bronchiolitis such as due to recurrent aspiration or atypical mycobacterial infection (MAI). ?3. Resolved bilateral lower lobe pneumonia seen on 08/26/2019  ? ? PFTs 10/2019 moderate airway obstruction, ratio 57, FEV1 1.39/48%, FVC 60%, no bronchodilator response, and DLCO 69%, TLC normal ?  ?CT angiogram chest 08/26/2019 patchy predominantly lower lobe opacities bilateral ?  ?CT chest with contrast 05/2019 moderate hiatal hernia small clustered nodules left lower  lobe, mild scarring left upper lobe and lingula, mild bronchiectasis to my review ?  ?CT chest/abdomen/pelvis 04/2013 clear lungs ? ? ? ?Review of Systems ?neg for any significant sore throat, dysphagia, itching, sneezing, nasal congestion or excess/ purulent secretions, fever, chills, sweats, unintended wt loss, pleuritic or exertional cp, hempoptysis, orthopnea pnd or change in chronic leg swelling. Also denies presyncope, palpitations, heartburn, abdominal pain, nausea, vomiting, diarrhea or change in bowel or urinary habits, dysuria,hematuria, rash, arthralgias, visual complaints, headache, numbness weakness or ataxia. ? ?   ?Objective:  ? Physical Exam ? ?Gen. Pleasant, obese, in no distress ?ENT - no lesions, no post nasal drip ?Neck: No JVD, no thyromegaly, no carotid bruits ?Lungs: no use of accessory muscles, no dullness to percussion, right basal rales no rhonchi  ?Cardiovascular: Rhythm regular, heart sounds  normal, no murmurs or gallops, no peripheral edema ?Musculoskeletal: No deformities, no cyanosis or clubbing , no tremors ? ? ? ?   ?Assessment & Plan:  ? ? ?

## 2021-05-08 NOTE — Assessment & Plan Note (Signed)
Given his diagnosis of Aspergillus skin lesions, we will repeat sputum culture, AFB and fungal to look for Aspergillus.  We will await dermatology report ? ?I once again emphasized airway clearance with ?-Saline nebs ?-We will go ahead with prescription for smart vest ? ?This patient has severe bronchiectasis causing daily productive cough for at least 6 months. SHe has frequent exacerbations (more than 2/year) requiring antibiotic therapy. Presence of bronchiectasis has been confirmed by CT chest. ?He has tried and failed the following airway clearance therapies : flutter valve, hypertonic saline, manual CPT ?Reasons above therapy failed include : no caregiver available, unable to tolerate positioning/percussion, did not mobilise secretions ? ?

## 2021-05-09 DIAGNOSIS — L02414 Cutaneous abscess of left upper limb: Secondary | ICD-10-CM | POA: Diagnosis not present

## 2021-05-09 DIAGNOSIS — Z79899 Other long term (current) drug therapy: Secondary | ICD-10-CM | POA: Diagnosis not present

## 2021-05-11 ENCOUNTER — Telehealth: Payer: Self-pay | Admitting: Pulmonary Disease

## 2021-05-11 NOTE — Telephone Encounter (Signed)
Reviewed dermatology report of cutaneous abscess, fungal culture 03/10/2020 showed Aspergillus. ?He is being treated with itraconazole 200 mg daily for 1 month. ? ?Please ensure that he has submitted sputum for culture and fungal ?

## 2021-05-11 NOTE — Telephone Encounter (Signed)
Called and spoke with patient to let him know of message from Dr. Elsworth Soho. He expressed understanding. States that he was able to do the AFB because he was only given 1 cup and not 2. Will wait on results to see if he still needs it. Nothing further needed at this time.  ?

## 2021-05-19 ENCOUNTER — Telehealth: Payer: Self-pay | Admitting: Pulmonary Disease

## 2021-05-19 MED ORDER — ALBUTEROL SULFATE (2.5 MG/3ML) 0.083% IN NEBU: 2.5000 mg | INHALATION_SOLUTION | Freq: Four times a day (QID) | RESPIRATORY_TRACT | 12 refills | Status: DC | PRN

## 2021-05-19 NOTE — Telephone Encounter (Signed)
I called the verify the correct pharmacy for the patient. I did let him know that some of the albuterol is on backorder and to have the pharmacy call us back or he could and let us know if they had an alternative in stock. Nothing further needed. ?

## 2021-05-23 ENCOUNTER — Telehealth: Payer: Self-pay | Admitting: Pulmonary Disease

## 2021-05-23 ENCOUNTER — Other Ambulatory Visit: Payer: Self-pay

## 2021-05-23 MED ORDER — TRELEGY ELLIPTA 100-62.5-25 MCG/ACT IN AEPB: 1.0000 | INHALATION_SPRAY | Freq: Every day | RESPIRATORY_TRACT | 6 refills | Status: DC

## 2021-05-23 NOTE — Telephone Encounter (Signed)
Patient states he would like RX faxed for a 90 day supply. Rx printed and faxed to [place] office with note to have Dr. Elsworth Soho sign and fax to [facility]  ?

## 2021-05-23 NOTE — Telephone Encounter (Signed)
Called and spoke to patient and made him aware of rx being sent in. He req. Rx go to [facility] RX sent in. Nothing further needed ?

## 2021-05-23 NOTE — Telephone Encounter (Signed)
Patient is calling to let Dr. Elsworth Soho know the Trelegy is working better than the [employer]. He also wants Dr. Elsworth Soho know that the vest is working as well  ? ?Routing to Dr. Elsworth Soho as an [place]  ?

## 2021-06-01 ENCOUNTER — Telehealth: Payer: Self-pay | Admitting: Pulmonary Disease

## 2021-06-01 NOTE — Telephone Encounter (Signed)
Called patient and he states that he likes the Trelegy 100 was better for him, but the [place] does not supply that inhaler.  ? ?Patient is using the smart vest therapy.  ? ?He wants to know what he should do? Should he go back on his Stilito inhaler? And he wants to know the results of his sputum sample as well. ? ?Dr Elsworth Soho please advise??!! ?

## 2021-06-05 LAB — RESPIRATORY CULTURE OR RESPIRATORY AND SPUTUM CULTURE
MICRO NUMBER:: 13122510
RESULT:: NORMAL
SPECIMEN QUALITY:: ADEQUATE

## 2021-06-05 LAB — FUNGUS CULTURE W SMEAR
MICRO NUMBER:: 13122509
SMEAR:: NONE SEEN
SPECIMEN QUALITY:: ADEQUATE

## 2021-06-06 DIAGNOSIS — L02414 Cutaneous abscess of left upper limb: Secondary | ICD-10-CM | POA: Diagnosis not present

## 2021-06-06 NOTE — Telephone Encounter (Signed)
? ? ? ?  Nathaniel Noel, MD  You; Tamala Julian, April L, RN Yesterday (12:55 PM)  ? ?He can go back to [employer].  But I do want him to be on an inhaled steroid which Stiolto does not have (and Trelegy did ) .  [facility] an appropriate second inhaler such as Flovent to add to [employer]  ? ?Sputum culture did not show any bacteria.  So he is cleared Pseudomonas that he had before  ?It showed Candida which is a different fungus and likely contaminant, did not show Aspergillus which he had on his skin   ? ? ?Called and spoke with patient to let him know of the recs from Dr. Elsworth Soho. He expressed understanding. Nothing further needed at this time. ? ?

## 2021-06-08 ENCOUNTER — Telehealth: Payer: Self-pay | Admitting: Pulmonary Disease

## 2021-06-08 DIAGNOSIS — J440 Chronic obstructive pulmonary disease with acute lower respiratory infection: Secondary | ICD-10-CM

## 2021-06-08 DIAGNOSIS — J479 Bronchiectasis, uncomplicated: Secondary | ICD-10-CM

## 2021-06-08 MED ORDER — ASMANEX (60 METERED DOSES) 220 MCG/ACT IN AEPB: 2.0000 | INHALATION_SPRAY | Freq: Every day | RESPIRATORY_TRACT | 2 refills | Status: DC

## 2021-06-08 MED ORDER — MOMETASONE FURO-FORMOTEROL FUM 200-5 MCG/ACT IN AERO: 2.0000 | INHALATION_SPRAY | Freq: Two times a day (BID) | RESPIRATORY_TRACT | 2 refills | Status: DC

## 2021-06-08 NOTE — Telephone Encounter (Signed)
Called patient and informed him that Dr Elsworth Soho was adding [facility] to his regimen. He is currently taking the Stilito daily. Dr Elsworth Soho says for him to take both daily. Patient verbalized understanding with no issues noted. Medication is going to the [place] in [place]. Nothing further needed  ?

## 2021-06-08 NOTE — Addendum Note (Signed)
Addended by: Monna Fam L on: 06/08/2021 04:17 PM ? ? Modules accepted: Orders ? ?

## 2021-06-08 NOTE — Telephone Encounter (Signed)
Wrong order placed (for dulera) ?Please change to mometasone 2 puffs daily (asmanex ) ?

## 2021-06-08 NOTE — Telephone Encounter (Signed)
Called patient and he states that he did call the [place] and ask if they could supply the [facility] and they told the patient no. The [facility] told the patient that they could give him MOMETASONE. I advised patient that I would send Dr Elsworth Soho a message to see what he would like to do moving forward with this and that we would call him back with Dr Angus Palms recommendations.  ? ?Dr Elsworth Soho please advise  ?

## 2021-06-08 NOTE — Telephone Encounter (Signed)
Called and left patient a message that I entered the wrong medication on my end. He is to NOT FILL Dulera inhaler he is to FILL the new inhaler. Patient stated that he understood and that he is to not take [place]. I apologized to him once again for the mix up on my end and that it was corrected now. New and correct order placed. Nothing further needed  ?

## 2021-06-19 ENCOUNTER — Ambulatory Visit: Payer: Medicare Other | Admitting: Cardiology

## 2021-06-21 ENCOUNTER — Encounter: Payer: Self-pay | Admitting: Cardiology

## 2021-06-21 ENCOUNTER — Ambulatory Visit (INDEPENDENT_AMBULATORY_CARE_PROVIDER_SITE_OTHER): Payer: Medicare Other | Admitting: Cardiology

## 2021-06-21 VITALS — BP 118/76 | HR 89 | Ht 71.0 in | Wt 183.2 lb

## 2021-06-21 DIAGNOSIS — E785 Hyperlipidemia, unspecified: Secondary | ICD-10-CM | POA: Diagnosis not present

## 2021-06-21 DIAGNOSIS — I130 Hypertensive heart and chronic kidney disease with heart failure and stage 1 through stage 4 chronic kidney disease, or unspecified chronic kidney disease: Secondary | ICD-10-CM

## 2021-06-21 DIAGNOSIS — Z79899 Other long term (current) drug therapy: Secondary | ICD-10-CM | POA: Diagnosis not present

## 2021-06-21 DIAGNOSIS — I4819 Other persistent atrial fibrillation: Secondary | ICD-10-CM | POA: Diagnosis not present

## 2021-06-21 DIAGNOSIS — Z7901 Long term (current) use of anticoagulants: Secondary | ICD-10-CM | POA: Diagnosis not present

## 2021-06-21 DIAGNOSIS — I2510 Atherosclerotic heart disease of native coronary artery without angina pectoris: Secondary | ICD-10-CM

## 2021-06-21 NOTE — Progress Notes (Signed)
?Cardiology Office Note:   ? ?Date:  06/21/2021  ? ?ID:  Nathaniel Leach, DOB 27-Oct-1940, MRN [id number] ? ?PCP:  Angelina Sheriff, MD  ?Cardiologist:  Shirlee More, MD   ? ?Referring MD: Angelina Sheriff, MD  ? ? ?ASSESSMENT:   ? ?1. Persistent atrial fibrillation ([place])   ?2. Chronic anticoagulation   ?3. High risk medication use   ?4. Coronary artery disease involving native coronary artery of native heart without angina pectoris   ?5. Hypertensive heart and chronic kidney disease with heart failure and stage 1 through stage 4 chronic kidney disease, or unspecified chronic kidney disease ([place])   ?6. Dyslipidemia   ? ?PLAN:   ? ?In order of problems listed above: ? ?Overall [name] is doing better he has rate controlled atrial fibrillation on low-dose digoxin beta-blocker and his current anticoagulant continue the same labs are being followed by the [facility] unfortunately I cannot see the EKG from yesterday or recent lab work. ?Stable CAD having no anginal discomfort continue current treatment including low-dose beta-blocker ?Intensity statin ?Stable hypertension BP at target kidney disease is stable he has no evidence of fluid overload and currently is not taking a loop diuretic ?Continue his high intensity statin Labs are followed through the [facility] ? ? ?Next appointment: He request to see me back in the office in March 2024 and wants to see both the [facility] cardiologist in my practice ? ? ?Medication Adjustments/Labs and Tests Ordered: ?Current medicines are reviewed at length with the patient today.  Concerns regarding medicines are outlined above.  ?No orders of the defined types were placed in this encounter. ? ?No orders of the defined types were placed in this encounter. ? ? ?Chief Complaint  ?Patient presents with  ? Follow-up  ? Coronary Artery Disease  ? Atrial Fibrillation  ? ? ?History of Present Illness:   ? ?Nathaniel Leach is a 81 y.o. male with a hx of  CAD hypertensive heart disease  with CKD and heart failure paroxysmal atrial fibrillation which is progressed to longstanding persistent rate controlled with digoxin off amiodarone chronically anticoagulated dyslipidemia and chronic lung disease with emphysema and bronchiectasis .He underwent a myocardial perfusion study 07/09/2019 showing EF 57% normal left ventricular function and no evidence of ischemia. Echocardiogram 07/03/2019 showed a low normal ejection fraction 50 to 01% normal diastolic filling pressure normal right ventricular function and mild enlargement of the ascending aorta.  He was  last seen 12/14/2020 with ongoing long COVID symptoms of shortness of breath fatigue and digoxin level was 0.40 was not toxic. ? ?Compliance with diet, lifestyle and medications: Yes ? ?Overall he is doing much better he is being treated for pulmonary aspergillosis and has respiratory treatment including percussion vest saline nebulizer flutter valve.  He still has cough wheezing and purulent sputum. ?He saw cardiologist at the [facility] yesterday he had an EKG performed I cannot see the results but he told me he was in rate controlled atrial fibrillation and the cardiologist concurred with rate control and anticoagulation ?He has no edema orthopnea he has had no angina palpitation or syncope ?He has taken antifungal agent not listed on his medications ?He tolerates his anticoagulant without bleeding and digoxin without GI toxicity ?He tolerates his statin without muscle pain or weakness ?Past Medical History:  ?Diagnosis Date  ? Acute recurrent maxillary sinusitis 03/30/2019  ? Anosmia 02/18/2019  ? APC (atrial premature contractions) 08/28/2016  ? Arrhythmia   ? CHF (congestive heart  failure) ([place])   ? Chronic anticoagulation 08/31/2014  ? Chronic diastolic heart failure ([place]) 08/31/2014  ? Chronic ethmoidal sinusitis 04/28/2019  ? Chronic kidney disease   ? CKD (chronic kidney disease) stage 3, GFR 30-59 ml/min (HCC) 11/23/2016  ? Coronary artery disease  involving native coronary artery of native heart 08/31/2014  ? Dyslipidemia 08/28/2016  ? Hyperlipidemia   ? Hypertension   ? Hypertensive heart and chronic kidney disease with heart failure and stage 1 through stage 4 chronic kidney disease, or unspecified chronic kidney disease ([place]) 08/31/2014  ? Microalbuminuria 05/02/2017  ? Nasal septal deviation 02/18/2019  ? On amiodarone therapy 08/31/2014  ? PAF (paroxysmal atrial fibrillation) ([place]) 08/31/2014  ? Overview:  rapid rate requiring beta blocker , CCB and amiodorone for rate control  ? 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 Overview:  2004  ? 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 ? ?Past Surgical History:  ?Procedure Laterality Date  ? APPENDECTOMY    ? CATARACT EXTRACTION    ? CHOLECYSTECTOMY    ? CORONARY ARTERY BYPASS GRAFT  2004  ? MOUTH SURGERY    ? RHINOPLASTY    ? ? ?Current Medications: ?Current Meds  ?Medication Sig  ? albuterol (PROVENTIL) (2.5 MG/3ML) 0.083% nebulizer solution Take 3 mLs (2.5 mg total) by nebulization every 6 (six) hours as needed for wheezing or shortness of breath.  ? apixaban (ELIQUIS) 5 MG TABS tablet Take 5 mg by mouth 2 (two) times daily.  ? atorvastatin (LIPITOR) 80 MG tablet Take 80 mg by mouth daily.   ? citalopram (CELEXA) 20 MG tablet Take 10 mg by mouth daily.  ? digoxin (LANOXIN) 0.125 MG tablet Take 1 tablet (0.125 mg total) by mouth 3 (three) times a week.  ? empagliflozin (JARDIANCE) 25 MG TABS tablet Take 12.5 mg by mouth in the morning.  ? glipiZIDE (GLUCOTROL) 5 MG tablet Take 2.5 mg by mouth daily.  ? guaiFENesin (MUCINEX) 600 MG 12 hr tablet Take 600 mg by mouth 2 (two) times daily.  ? ipratropium (ATROVENT HFA) 17 MCG/ACT inhaler Inhale 2 puffs into the lungs 2 (two) times daily.  ? metoprolol tartrate (LOPRESSOR) 100 MG tablet Take 100 mg by mouth 2 (two) times daily.  ? Multiple Vitamin (MULTIVITAMIN) capsule Take 1 capsule by mouth daily.   ? Omega-3 Fatty Acids (FISH OIL PO) Take by mouth.  ? pantoprazole  (PROTONIX) 20 MG tablet TAKE ONE TABLET BY MOUTH DAILY (TAKE ON AN EMPTY STOMACH 30 MINUTES PRIOR TO A MEAL) SUBSTITUTE FOR OMEPRAZOLE  ? sodium chloride HYPERTONIC 3 % nebulizer solution Take by nebulization daily. Dx J47.9 (Patient taking differently: Take 4 mLs by nebulization as needed for cough. Dx J47.9)  ? vitamin E 400 UNIT capsule Take 400 Units by mouth daily.  ?  ? ?Allergies:   Sertraline  ? ?Social History  ? ?Socioeconomic History  ? Marital status: Married  ?  Spouse name: Not on file  ? Number of children: Not on file  ? Years of education: Not on file  ? Highest education level: Not on file  ?Occupational History  ? Not on file  ?Tobacco Use  ? Smoking status: Former  ?  Packs/day: 1.50  ?  Years: 20.00  ?  Pack years: 30.00  ?  Types: Cigarettes  ? Smokeless tobacco: Former  ?  Types: Chew  ? Tobacco comments:  ?  Quit in 1975  ?Vaping Use  ? Vaping Use: Never used  ?Substance and Sexual  Activity  ? Alcohol use: Not Currently  ? Drug use: Never  ? Sexual activity: Not on file  ?Other Topics Concern  ? Not on file  ?Social History Narrative  ? Not on file  ? ?Social Determinants of Health  ? ?Financial Resource Strain: Not on file  ?Food Insecurity: Not on file  ?Transportation Needs: Not on file  ?Physical Activity: Not on file  ?Stress: Not on file  ?Social Connections: Not on file  ?  ? ?Family History: ?The patient's family history includes CAD in his father; Diabetes in his brother; Stroke in his mother. ?ROS:   ?Please see the history of present illness.    ?All other systems reviewed and are negative. ? ?EKGs/Labs/Other Studies Reviewed:   ? ?The following studies were reviewed today: ? ? ?Recent Labs: ?12/20/2020: BUN 9; Creatinine, Ser 1.01; Potassium 4.5; Sodium 141  ?Recent Lipid Panel ?   ?Component Value Date/Time  ? CHOL 122 12/20/2020 1626  ? TRIG 125 12/20/2020 1626  ? HDL 51 12/20/2020 1626  ? CHOLHDL 2.4 12/20/2020 1626  ? LDLCALC 49 12/20/2020 1626  ? ? ?Physical Exam:   ? ?VS:   BP 118/76 (BP Location: Left Arm, Patient Position: Sitting)   Pulse 89   Ht '5\' 11"'$  (1.803 m)   Wt 183 lb 3.2 oz (83.1 kg)   SpO2 96%   BMI 25.55 kg/m?    ? ?Wt Readings from Last 3 Encounters:  ?06/21/21 183

## 2021-06-21 NOTE — Patient Instructions (Signed)
Medication Instructions:  ?Your physician recommends that you continue on your current medications as directed. Please refer to the Current Medication list given to you today.  ?*If you need a refill on your cardiac medications before your next appointment, please call your pharmacy* ? ? ?Lab Work: ?None Ordered ?If you have labs (blood work) drawn today and your tests are completely normal, you will receive your results only by: ?MyChart Message (if you have MyChart) OR ?A paper copy in the mail ?If you have any lab test that is abnormal or we need to change your treatment, we will call you to review the results. ? ? ?Testing/Procedures: ?None Ordered ? ? ?Follow-Up: ?At [facility], you and your health needs are our priority.  As part of our continuing mission to provide you with exceptional heart care, we have created designated Provider Care Teams.  These Care Teams include your primary Cardiologist (physician) and Advanced Practice Providers (APPs -  Physician Assistants and Nurse Practitioners) who all work together to provide you with the care you need, when you need it. ? ?We recommend signing up for the patient portal called "MyChart".  Sign up information is provided on this After Visit Summary.  MyChart is used to connect with patients for Virtual Visits (Telemedicine).  Patients are able to view lab/test results, encounter notes, upcoming appointments, etc.  Non-urgent messages can be sent to your provider as well.   ?To learn more about what you can do with MyChart, go to [email or web address].   ? ?Your next appointment:   ?March 2024    ? ?The format for your next appointment:   ?In Person ? ?Provider:   ?Shirlee More, MD ? ? ?Other Instructions ?NA  ?

## 2021-06-22 ENCOUNTER — Telehealth: Payer: Self-pay | Admitting: Pulmonary Disease

## 2021-06-23 MED ORDER — MOMETASONE FUROATE 220 MCG/ACT IN AEPB: 2.0000 | INHALATION_SPRAY | Freq: Every day | RESPIRATORY_TRACT | 11 refills | Status: DC

## 2021-06-23 NOTE — Telephone Encounter (Signed)
Called and spoke with patient who states that he received an email from the [place] stating that they don't cover Trelegy but they will cover Asmanex (mometasone). RX has been sent to pharmacy. Nothing further needed at this time. ?

## 2021-07-07 ENCOUNTER — Telehealth: Payer: Self-pay | Admitting: Pulmonary Disease

## 2021-07-10 NOTE — Telephone Encounter (Signed)
ATC to call patient. LVMTCB ?

## 2021-07-13 NOTE — Telephone Encounter (Signed)
Seems like encounter was open in error so closing encounter.  

## 2021-07-25 ENCOUNTER — Telehealth: Payer: Self-pay | Admitting: Pulmonary Disease

## 2021-07-25 MED ORDER — MOMETASONE FUROATE 220 MCG/ACT IN AEPB
2.0000 | INHALATION_SPRAY | Freq: Every day | RESPIRATORY_TRACT | 11 refills | Status: DC
Start: 2021-07-25 — End: 2022-07-10

## 2021-07-25 NOTE — Telephone Encounter (Signed)
Spoke with the pt  He says his rx for asmanex has to be faxed to the [place] attn Dr Durene Romans- [phone]  I have printed and had MR sign in RA's absence and have faxed this to the number provided

## 2021-07-27 DEATH — deceased

## 2021-08-08 ENCOUNTER — Ambulatory Visit: Payer: Medicare Other | Admitting: Adult Health

## 2021-08-17 ENCOUNTER — Telehealth: Payer: Self-pay | Admitting: *Deleted

## 2021-08-17 ENCOUNTER — Encounter: Payer: Self-pay | Admitting: Adult Health

## 2021-08-17 ENCOUNTER — Ambulatory Visit (INDEPENDENT_AMBULATORY_CARE_PROVIDER_SITE_OTHER): Payer: Medicare Other | Admitting: Adult Health

## 2021-08-17 DIAGNOSIS — I5032 Chronic diastolic (congestive) heart failure: Secondary | ICD-10-CM

## 2021-08-17 DIAGNOSIS — J449 Chronic obstructive pulmonary disease, unspecified: Secondary | ICD-10-CM | POA: Insufficient documentation

## 2021-08-17 DIAGNOSIS — J479 Bronchiectasis, uncomplicated: Secondary | ICD-10-CM

## 2021-08-17 DIAGNOSIS — I2510 Atherosclerotic heart disease of native coronary artery without angina pectoris: Secondary | ICD-10-CM

## 2021-08-17 MED ORDER — GUAIFENESIN ER 600 MG PO TB12: 600.0000 mg | ORAL_TABLET | Freq: Two times a day (BID) | ORAL | 4 refills | Status: DC

## 2021-08-17 NOTE — Assessment & Plan Note (Signed)
Continue on mucociliary clearance , doing well with vest   Plan  Patient Instructions  Continue on Stiolto 2 puffs daily Begin Asmanex 2 puffs daily when availble .  Activity as tolerated.  Continue on Flutter valve Continue on VEST Twice daily   Continue on Hypertonic nebs daily  Follow up with Dr. Elsworth Soho  In 4 months  and As needed   Please contact office for sooner follow up if symptoms do not improve or worsen or seek emergency care

## 2021-08-17 NOTE — Telephone Encounter (Signed)
Faxed prescription to [facility] attention Dr. Durene Romans for Mucinex per patient request to [phone].  Received fax confirmation that it was sent successfully.

## 2021-08-17 NOTE — Patient Instructions (Addendum)
Continue on Stiolto 2 puffs daily Begin Asmanex 2 puffs daily when availble .  Activity as tolerated.  Continue on Flutter valve Continue on VEST Twice daily   Continue on Hypertonic nebs daily  Follow up with Dr. Elsworth Soho  In 4 months  and As needed   Please contact office for sooner follow up if symptoms do not improve or worsen or seek emergency care

## 2021-08-17 NOTE — Assessment & Plan Note (Signed)
Appears compensated cont on current regimen

## 2021-08-17 NOTE — Telephone Encounter (Signed)
Prescription for Mucinex 600 mg twice daily (90 day supply) faxed to [facility] to Dr. Durene Romans [phone]) per patient request.

## 2021-08-17 NOTE — Assessment & Plan Note (Signed)
COPD with emphysema, bronchiectasis and asthma - doing better since starting VEST therapy  Continue with Stiolto . Starting Asmanex soon.   Plan  Patient Instructions  Continue on Stiolto 2 puffs daily Begin Asmanex 2 puffs daily when availble .  Activity as tolerated.  Continue on Flutter valve Continue on VEST Twice daily   Continue on Hypertonic nebs daily  Follow up with Dr. Elsworth Soho  In 4 months  and As needed   Please contact office for sooner follow up if symptoms do not improve or worsen or seek emergency care

## 2021-08-17 NOTE — Addendum Note (Signed)
Addended by: Vanessa Barbara on: 08/17/2021 02:51 PM   Modules accepted: Orders

## 2021-08-17 NOTE — Progress Notes (Signed)
Cannot imagine6  '@Patient'$  ID: Nathaniel Leach, male    DOB: June 18, 1940, 81 y.o.   MRN: [id number]  Chief Complaint  Patient presents with   Follow-up    Referring provider: Angelina Sheriff, MD  HPI: 81 year old male former smoker followed for emphysema, asthma, bronchiectasis and a history of Pseudomonas pneumonia Suspected COVID-19 infection in February 2020 with acute respiratory failure and loss of taste and smell. Gets his care and prescriptions through the [place] system Medical history significant for A-fib on Eliquis , DM   TEST/EVENTS :  HRCT 09/2019 > Mild centrilobular and paraseptal emphysema with prominent diffuse bronchial wall thickening, suggesting COPD. 2. Mild diffuse cylindrical bronchiectasis with moderate patchy tree-in-bud opacities most prominent in the dependent lower lobes. Findings suggest chronic/recurrent bronchiolitis such as due to recurrent aspiration or atypical mycobacterial infection (MAI). 3. Resolved bilateral lower lobe pneumonia seen on 08/26/2019    PFTs 10/2019 moderate airway obstruction, ratio 57, FEV1 1.39/48%, FVC 60%, no bronchodilator response, and DLCO 69%, TLC normal  Spirometry 12/2019 >> no airway obstruction, ratio 73, FEV1 85%   CT angiogram chest 08/26/2019 patchy predominantly lower lobe opacities bilateral   CT chest with contrast 05/2019 moderate hiatal hernia small clustered nodules left lower lobe, mild scarring left upper lobe and lingula, mild bronchiectasis to my review   CT chest/abdomen/pelvis 04/2013 clear lungs   Sputum AFB 11/16/2019  Neg .  Sputum culture 11/16/2019 + for Pseudomonas-pansensitive   High-resolution CT chest August 2021 mild emphysema with prominent diffuse bronchial wall thickening, mild bronchiectasis with moderate patchy tree-in-bud opacities in the dependent lower lobes.  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  Resolved bilateral lower lobe   08/17/2021 Follow up ; Emphysema ,  Asthma and Bronchiectasis  Patient presents for a 37-monthfollow-up.  Patient has underlying emphysema and bronchiectasis.  Patient remains on Stiolto daily . Going to start Asmanex , [place] [facility]  He uses hypertonic nebs couple times a week.  Uses flutter valve some .  Patient says overall breathing is doing okay.  He has a daily cough with some thick mucus intermittently.  He denies any hemoptysis, fever, chest pain or orthopnea. Remains on mucinex Twice daily  , [place] [facility] [place]  Now on Smart Vest Twice daily  , feels it really is helping a lot . Mucinex is easier to get up and is thinner. Has noticed since going on VEST O2 sats are higher now ranging 97-100% .   Patient is widowed.  Lives alone.  Is able to drive.  Helps with his grandkids. Grandaughter is 15 . Son lives close by. [place].   Still never regained taste after covid. Things taste bland.  Lost 80lbs the first year after covid infection in 2020. Weight steady for last year. Eating better.    Allergies  Allergen Reactions   Sertraline Other (See Comments)    Jerking and unable to speak well    Immunization History  Administered Date(s) Administered   Influenza Split 12/28/2014   Influenza, High Dose Seasonal PF 11/26/2012, 11/26/2013, 11/11/2015, 12/05/2016   Influenza-Unspecified 11/27/2003, 12/28/2003, 11/26/2005, 11/27/2007, 11/26/2008, 10/27/2009, 09/27/2010, 10/28/2011, 10/27/2017, 10/28/2019, 12/27/2020   PFIZER Comirnaty(Gray Top)Covid-19 Tri-Sucrose Vaccine 07/05/2020   PFIZER(Purple Top)SARS-COV-2 Vaccination 03/25/2019, 04/16/2019, 11/27/2019, 01/03/2020   Pfizer Covid-19 Vaccine Bivalent Booster 166yr& up 01/31/2021   Pneumococcal Conjugate-13 06/09/2013   Pneumococcal Polysaccharide-23 01/31/2021   Pneumococcal-Unspecified 09/26/2005   Td (Adult) 11/08/2016  Tdap 12/28/2006    Past Medical History:  Diagnosis Date   Acute recurrent  maxillary sinusitis 03/30/2019   Anosmia 02/18/2019   APC (atrial premature contractions) 08/28/2016   Arrhythmia    CHF (congestive heart failure) (HCC)    Chronic anticoagulation 08/31/2014   Chronic diastolic heart failure ([place]) 08/31/2014   Chronic ethmoidal sinusitis 04/28/2019   Chronic kidney disease    CKD (chronic kidney disease) stage 3, GFR 30-59 ml/min (HCC) 11/23/2016   Coronary artery disease involving native coronary artery of native heart 08/31/2014   Dyslipidemia 08/28/2016   Hyperlipidemia    Hypertension    Hypertensive heart and chronic kidney disease with heart failure and stage 1 through stage 4 chronic kidney disease, or unspecified chronic kidney disease ([place]) 08/31/2014   Microalbuminuria 05/02/2017   Nasal septal deviation 02/18/2019   On amiodarone therapy 08/31/2014   PAF (paroxysmal atrial fibrillation) ([place]) 08/31/2014   Overview:  rapid rate requiring beta blocker , CCB and amiodorone for rate control   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 60 mL 12   apixaban (ELIQUIS) 5 MG TABS tablet Take 5 mg by mouth 2 (two) times daily.     atorvastatin (LIPITOR) 80 MG tablet Take 80 mg by mouth daily.      citalopram (CELEXA) 20 MG tablet Take 10 mg by mouth daily.     digoxin (LANOXIN) 0.125 MG tablet Take 1 tablet (0.125 mg total) by mouth 3 (three) times a week. 35 tablet 3    empagliflozin (JARDIANCE) 25 MG TABS tablet Take 12.5 mg by mouth in the morning.     glipiZIDE (GLUCOTROL) 5 MG tablet Take 2.5 mg by mouth daily.     guaiFENesin (MUCINEX) 600 MG 12 hr tablet Take 600 mg by mouth 2 (two) times daily.     ipratropium (ATROVENT HFA) 17 MCG/ACT inhaler Inhale 2 puffs into the lungs 2 (two) times daily. 1 each 12   metoprolol tartrate (LOPRESSOR) 100 MG tablet Take 100 mg by mouth 2 (two) times daily.     mometasone (ASMANEX) 220 MCG/ACT inhaler Inhale 2 puffs into the lungs daily. 1 each 11   Multiple Vitamin (MULTIVITAMIN) capsule Take 1 capsule by mouth daily.      Omega-3 Fatty Acids (FISH OIL PO) Take by mouth.     pantoprazole (PROTONIX) 20 MG tablet TAKE ONE TABLET BY MOUTH DAILY (TAKE ON AN EMPTY STOMACH 30 MINUTES PRIOR TO A MEAL) SUBSTITUTE FOR OMEPRAZOLE     sodium chloride HYPERTONIC 3 % nebulizer solution Take by nebulization daily. Dx J47.9 (Patient taking differently: Take 4 mLs by nebulization as needed for cough. Dx J47.9) 120 mL 5   vitamin E 400 UNIT capsule Take 400 Units by mouth daily.     No facility-administered medications prior to visit.     Review of Systems:  Constitutional:   No  weight loss, night sweats,  Fevers, chills, fatigue, or  lassitude.  HEENT:   No headaches,  Difficulty swallowing,  Tooth/dental problems, or  Sore throat,                No sneezing, itching, ear ache,  +nasal congestion, post nasal drip,   CV:  No chest pain,  Orthopnea, PND, swelling in lower extremities, anasarca, dizziness, palpitations, syncope.   GI  No heartburn, indigestion, abdominal pain, nausea, vomiting, diarrhea, change in bowel habits, loss of appetite, bloody stools.   Resp: .  No chest wall deformity  Skin: no rash or lesions.  GU: no dysuria, change in color of urine, no urgency or frequency.  No flank pain, no hematuria   MS:  No joint pain or swelling.  No decreased range of motion.  No back pain.    Physical Exam  BP  108/60 (BP Location: Left Arm, Patient Position: Sitting, Cuff Size: Normal)   Pulse 90   Temp 98 F (36.7 C) (Oral)   Ht '5\' 11"'$  (1.803 m)   Wt 182 lb (82.6 kg)   SpO2 100%   BMI 25.38 kg/m   GEN: A/Ox3; pleasant , NAD, well nourished    HEENT:  [place]/AT,   NOSE-clear, THROAT-clear, no lesions, no postnasal drip or exudate noted.   NECK:  Supple w/ fair ROM; no JVD; normal carotid impulses w/o bruits; no thyromegaly or nodules palpated; no lymphadenopathy.    RESP  Scattered rhonchi , no accessory muscle use, no dullness to percussion  CARD:  RRR, no m/r/g, tr peripheral edema, pulses intact, no cyanosis or clubbing.  GI:   Soft & nt; nml bowel sounds; no organomegaly or masses detected.   Musco: Warm bil, no deformities or joint swelling noted.   Neuro: alert, no focal deficits noted.    Skin: Warm, no lesions or rashes    Lab Results:   BNP   Imaging: No results found.       Latest Ref Rng & Units 11/11/2019    9:39 AM  PFT Results  FVC-Pre L 2.43   FVC-Predicted Pre % 60   FVC-Post L 2.28   FVC-Predicted Post % 56   Pre FEV1/FVC % % 57   Post FEV1/FCV % % 62   FEV1-Pre L 1.39   FEV1-Predicted Pre % 48   FEV1-Post L 1.42   DLCO uncorrected ml/min/mmHg 16.95   DLCO UNC% % 69   DLCO corrected ml/min/mmHg 16.95   DLCO COR %Predicted % 69   DLVA Predicted % 100   TLC L 6.50   TLC % Predicted % 92   RV % Predicted % 152     No results found for: "NITRICOXIDE"      Assessment & Plan:   COPD (chronic obstructive pulmonary disease) (HCC) COPD with emphysema, bronchiectasis and asthma - doing better since starting VEST therapy  Continue with Stiolto . Starting Asmanex soon.   Plan  Patient Instructions  Continue on Stiolto 2 puffs daily Begin Asmanex 2 puffs daily when availble .  Activity as tolerated.  Continue on Flutter valve Continue on VEST Twice daily   Continue on Hypertonic nebs daily  Follow up with Dr. Elsworth Soho  In 4 months  and As  needed   Please contact office for sooner follow up if symptoms do not improve or worsen or seek emergency care      Chronic diastolic heart failure ([place]) Appears compensated cont on current  regimen   Bronchiectasis without complication ([place]) Continue on mucociliary clearance , doing well with vest   Plan  Patient Instructions  Continue on Stiolto 2 puffs daily Begin Asmanex 2 puffs daily when availble .  Activity as tolerated.  Continue on Flutter valve Continue on VEST Twice daily   Continue on Hypertonic nebs daily  Follow up with Dr. Elsworth Soho  In 4 months  and As needed   Please contact office for sooner follow up if symptoms do not improve or worsen or seek emergency care        Rexene Edison, NP 08/17/2021

## 2021-08-24 DIAGNOSIS — L02413 Cutaneous abscess of right upper limb: Secondary | ICD-10-CM | POA: Diagnosis not present

## 2021-08-24 DIAGNOSIS — Z6824 Body mass index (BMI) 24.0-24.9, adult: Secondary | ICD-10-CM | POA: Diagnosis not present

## 2021-08-24 DIAGNOSIS — M25512 Pain in left shoulder: Secondary | ICD-10-CM | POA: Diagnosis not present

## 2021-08-24 DIAGNOSIS — I951 Orthostatic hypotension: Secondary | ICD-10-CM | POA: Diagnosis not present

## 2021-08-24 DIAGNOSIS — I5020 Unspecified systolic (congestive) heart failure: Secondary | ICD-10-CM | POA: Diagnosis not present

## 2021-08-24 DIAGNOSIS — L02414 Cutaneous abscess of left upper limb: Secondary | ICD-10-CM | POA: Diagnosis not present

## 2021-08-24 DIAGNOSIS — L209 Atopic dermatitis, unspecified: Secondary | ICD-10-CM | POA: Diagnosis not present

## 2021-08-24 DIAGNOSIS — Z0000 Encounter for general adult medical examination without abnormal findings: Secondary | ICD-10-CM | POA: Diagnosis not present

## 2021-10-27 DIAGNOSIS — M25512 Pain in left shoulder: Secondary | ICD-10-CM | POA: Diagnosis not present

## 2021-10-27 DIAGNOSIS — M25511 Pain in right shoulder: Secondary | ICD-10-CM | POA: Diagnosis not present

## 2021-10-27 DIAGNOSIS — Z6825 Body mass index (BMI) 25.0-25.9, adult: Secondary | ICD-10-CM | POA: Diagnosis not present

## 2021-10-27 DIAGNOSIS — Z9181 History of falling: Secondary | ICD-10-CM | POA: Diagnosis not present

## 2021-11-14 DIAGNOSIS — M19011 Primary osteoarthritis, right shoulder: Secondary | ICD-10-CM | POA: Diagnosis not present

## 2021-11-14 DIAGNOSIS — M19012 Primary osteoarthritis, left shoulder: Secondary | ICD-10-CM | POA: Diagnosis not present

## 2021-11-23 DIAGNOSIS — L209 Atopic dermatitis, unspecified: Secondary | ICD-10-CM | POA: Diagnosis not present

## 2021-11-23 DIAGNOSIS — D485 Neoplasm of uncertain behavior of skin: Secondary | ICD-10-CM | POA: Diagnosis not present

## 2021-11-29 IMAGING — DX DG CHEST 2V
2 series · 2 of 2 positions shown · non-contrast
Comparison: Radiograph 12/30/2019

CLINICAL DATA: cough, congestion, wheezing

EXAM:
CHEST - 2 VIEW

[chest pa]
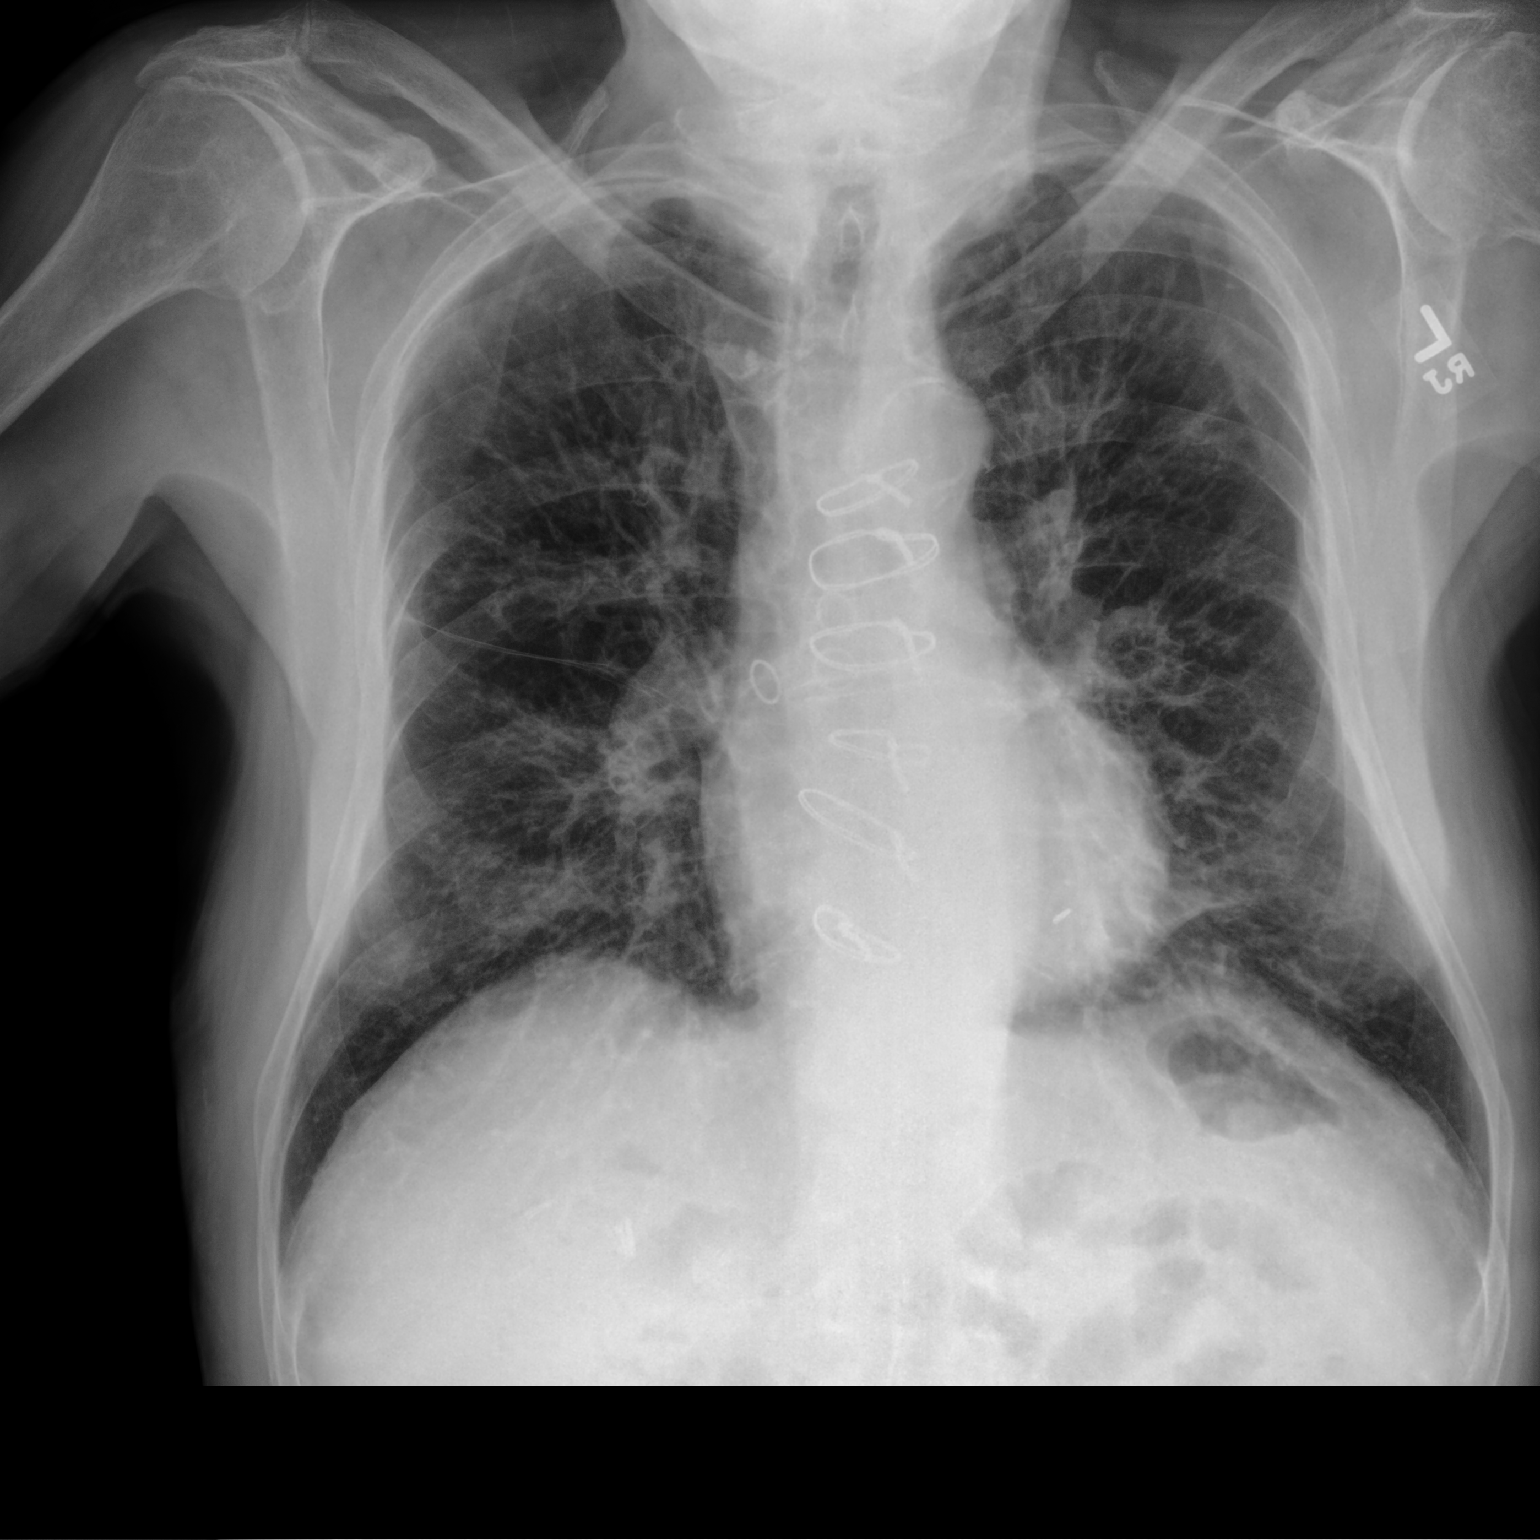

[chest lat]
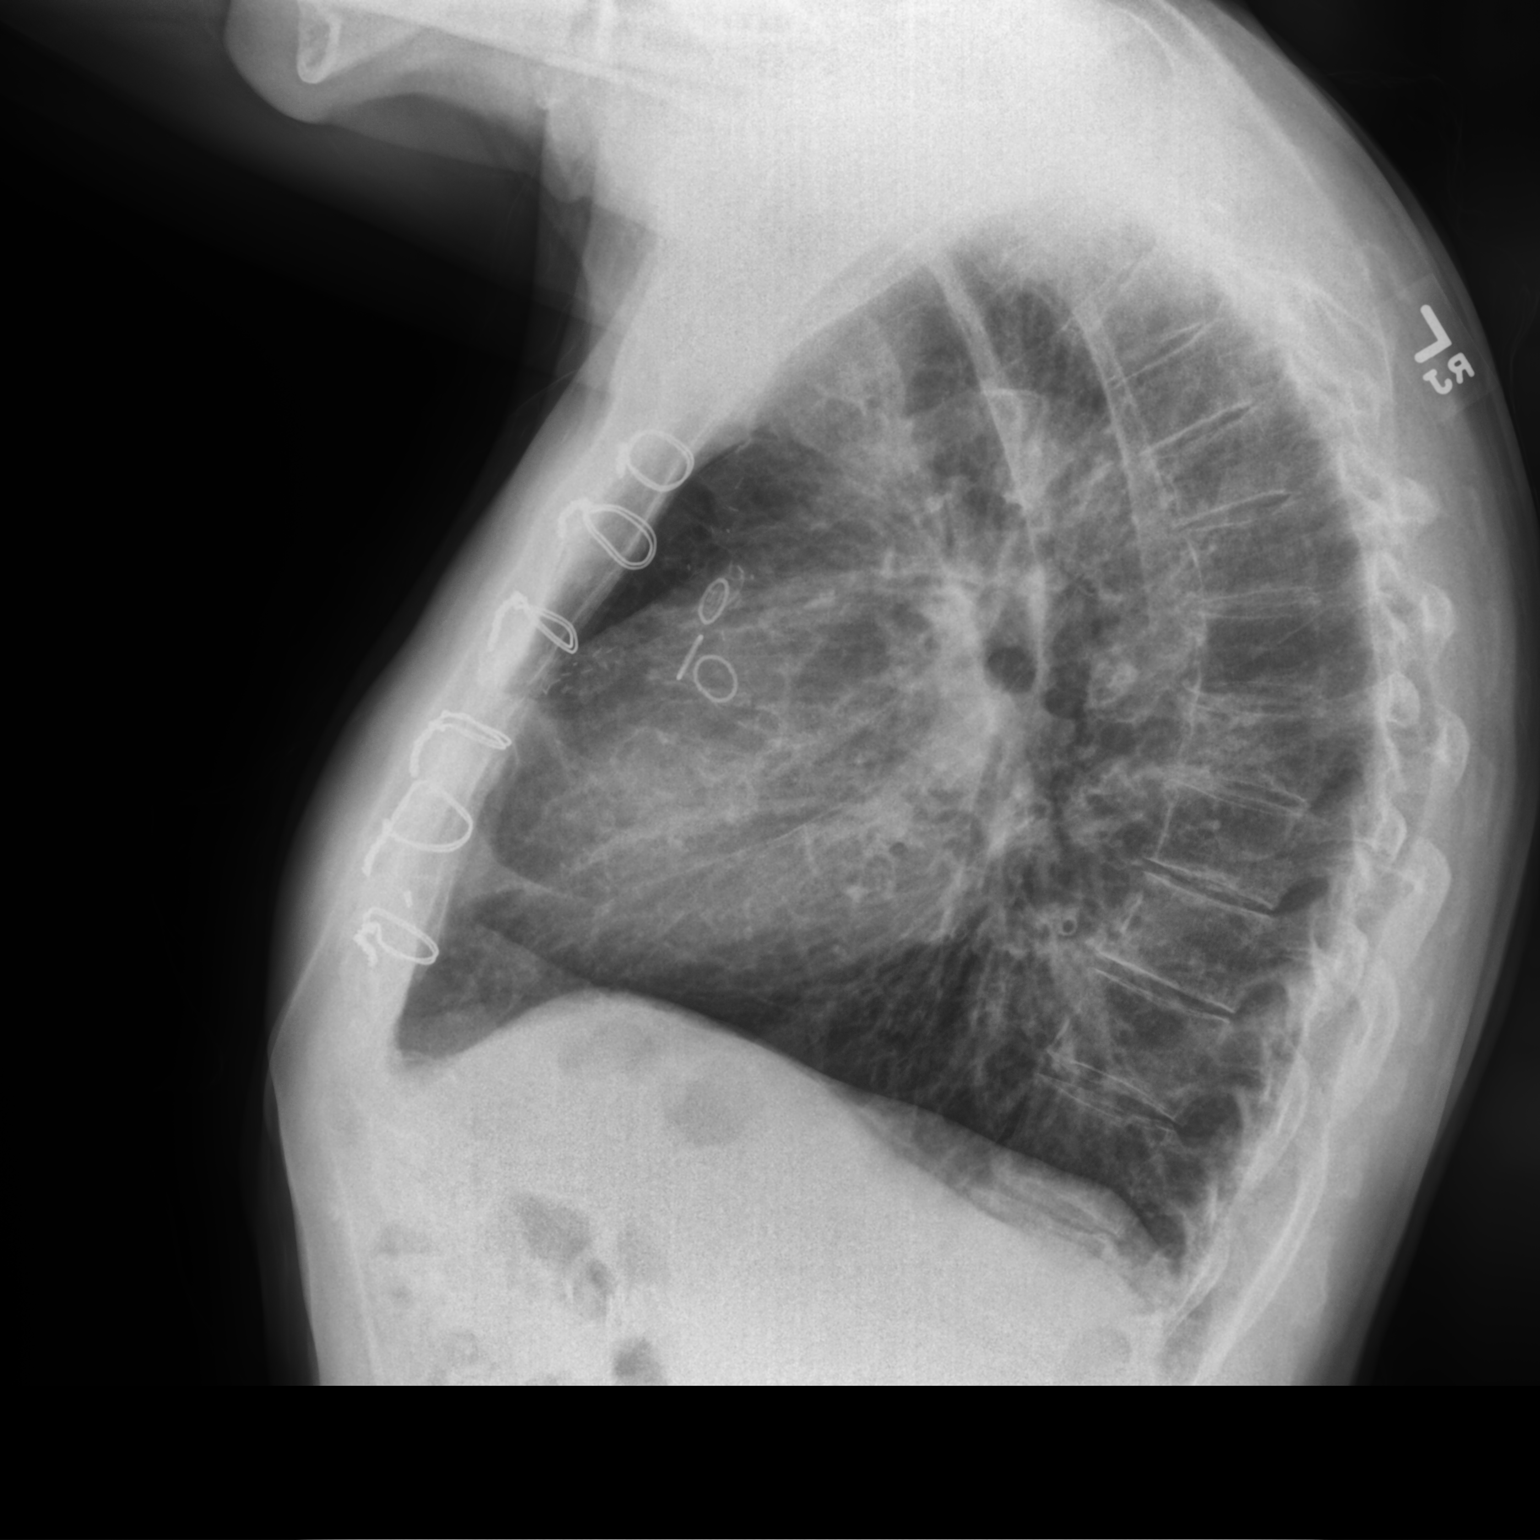

[2 of 2 positions shown; findings below may reference images not displayed]

FINDINGS: Unchanged cardiomediastinal silhouette. There are chronic bilateral
reticulonodular opacities and diffuse bronchiectasis with bronchial
wall thickening. No new focal airspace disease. No large pleural
effusion or visible pneumothorax. No acute osseous abnormality.
Thoracic spondylosis.
IMPRESSION: Chronic bilateral reticulonodular opacities and diffuse
bronchiectasis. No new focal airspace consolidation.

## 2021-12-19 ENCOUNTER — Ambulatory Visit: Payer: Medicare Other | Admitting: Pulmonary Disease

## 2021-12-20 ENCOUNTER — Ambulatory Visit (INDEPENDENT_AMBULATORY_CARE_PROVIDER_SITE_OTHER): Payer: Medicare Other | Admitting: Pulmonary Disease

## 2021-12-20 ENCOUNTER — Encounter: Payer: Self-pay | Admitting: Pulmonary Disease

## 2021-12-20 VITALS — BP 118/62 | HR 82 | Temp 97.7°F | Ht 71.0 in | Wt 186.6 lb

## 2021-12-20 DIAGNOSIS — Z23 Encounter for immunization: Secondary | ICD-10-CM

## 2021-12-20 DIAGNOSIS — J45909 Unspecified asthma, uncomplicated: Secondary | ICD-10-CM | POA: Diagnosis not present

## 2021-12-20 DIAGNOSIS — I2510 Atherosclerotic heart disease of native coronary artery without angina pectoris: Secondary | ICD-10-CM

## 2021-12-20 DIAGNOSIS — J449 Chronic obstructive pulmonary disease, unspecified: Secondary | ICD-10-CM

## 2021-12-20 DIAGNOSIS — B4481 Allergic bronchopulmonary aspergillosis: Secondary | ICD-10-CM | POA: Diagnosis not present

## 2021-12-20 DIAGNOSIS — J479 Bronchiectasis, uncomplicated: Secondary | ICD-10-CM | POA: Diagnosis not present

## 2021-12-20 NOTE — Progress Notes (Signed)
   Subjective:    Patient ID: Nathaniel Leach, male    DOB: 01-Jan-1941, 81 y.o.   MRN: [id number]  HPI  81 yo remote smoker for follow-up of bronchiectasis and asthma 08/2019 sputum culture positive for Pseudomonas sensitive to Cipro/levofloxacin  PFT showed airway obstruction >> reversed on follow-up spirometry. 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 fib  on anticoagulation  37-monthfollow-up visit. He reports that he took voriconazole twice daily for 4 weeks and then the lesions returned on his skin, he took another course. I reviewed that sputum culture obtained 04/2021 was negative for bacteria, showed Candida.  LFTs were normal. He admits to using vest once daily, has backed off hypertonic saline and flutter valve.  Breathing is okay he denies fevers  Significant tests/ events reviewed  Spirometry 12/2019 >> no airway obstruction, ratio 73, FEV1 85%   Sputum AFB 11/16/2019  Neg .  Sputum culture 11/16/2019 + for Pseudomonas-pansensitive  HRCT 09/2019 > Mild centrilobular and paraseptal emphysema with prominent diffuse bronchial wall thickening, suggesting COPD. 2. Mild diffuse cylindrical bronchiectasis with moderate patchy tree-in-bud opacities most prominent in the dependent lower lobes. Findings suggest chronic/recurrent bronchiolitis such as due to recurrent aspiration or atypical mycobacterial infection (MAI). 3. Resolved bilateral lower lobe pneumonia seen on 08/26/2019    PFTs 10/2019 moderate airway obstruction, ratio 57, FEV1 1.39/48%, FVC 60%, no bronchodilator response, and DLCO 69%, TLC normal   CT angiogram chest 08/26/2019 patchy predominantly lower lobe opacities bilateral   CT chest with contrast 05/2019 moderate hiatal hernia small clustered nodules left lower lobe, mild scarring left upper lobe and lingula, mild  bronchiectasis to my review   CT chest/abdomen/pelvis 04/2013 clear lungs   Review of Systems neg for any significant sore throat, dysphagia, itching, sneezing, nasal congestion or excess/ purulent secretions, fever, chills, sweats, unintended wt loss, pleuritic or exertional cp, hempoptysis, orthopnea pnd or change in chronic leg swelling. Also denies presyncope, palpitations, heartburn, abdominal pain, nausea, vomiting, diarrhea or change in bowel or urinary habits, dysuria,hematuria, rash, arthralgias, visual complaints, headache, numbness weakness or ataxia.     Objective:   Physical Exam  Gen. 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

## 2021-12-20 NOTE — Assessment & Plan Note (Addendum)
   X HRCT chest  Once again emphasized the importance of airway clearance measures with Mucinex, hypertonic saline, vest and flutter valve  X flu shot today RSV recommended

## 2021-12-20 NOTE — Patient Instructions (Addendum)
X aspergillus IgE blood work  X HRCT chest   X flu shot today RSV recommended

## 2021-12-26 LAB — ASPERGILLUS IGE PANEL
A. Amstel/Glaucu Class Interp: 0
A. Flavus Class Interp: 0
A. Fumigatus Class Interp: 0
A. Nidulans Class Interp: 0
A. Niger Class Interp: 0
A. Versicolor Class Interp: 0
Aspergillus amstel/glaucu IgE*: <0.35 kU/L (ref <0.35)
Aspergillus flavus IgE: <0.35 kU/L (ref <0.35)
Aspergillus fumigatus IgE: <0.1 kU/L (ref <0.35)
Aspergillus nidulans IgE: <0.35 kU/L (ref <0.35)
Aspergillus niger IgE: <0.1 kU/L (ref <0.35)
Aspergillus versicolor IgE: <0.1 kU/L (ref <0.35)

## 2021-12-28 DIAGNOSIS — J40 Bronchitis, not specified as acute or chronic: Secondary | ICD-10-CM | POA: Diagnosis not present

## 2021-12-28 DIAGNOSIS — J479 Bronchiectasis, uncomplicated: Secondary | ICD-10-CM | POA: Diagnosis not present

## 2021-12-28 DIAGNOSIS — J329 Chronic sinusitis, unspecified: Secondary | ICD-10-CM | POA: Diagnosis not present

## 2022-01-29 ENCOUNTER — Ambulatory Visit
Admission: RE | Admit: 2022-01-29 | Discharge: 2022-01-29 | Disposition: A | Discharge status: Home / Self Care | Payer: Medicare Other | Source: Ambulatory Visit | Attending: Pulmonary Disease | Admitting: Pulmonary Disease

## 2022-01-29 DIAGNOSIS — I517 Cardiomegaly: Secondary | ICD-10-CM | POA: Diagnosis not present

## 2022-01-29 DIAGNOSIS — J479 Bronchiectasis, uncomplicated: Secondary | ICD-10-CM | POA: Diagnosis not present

## 2022-01-29 DIAGNOSIS — J449 Chronic obstructive pulmonary disease, unspecified: Secondary | ICD-10-CM

## 2022-01-29 DIAGNOSIS — I700 Atherosclerosis of aorta: Secondary | ICD-10-CM | POA: Diagnosis not present

## 2022-01-29 DIAGNOSIS — K449 Diaphragmatic hernia without obstruction or gangrene: Secondary | ICD-10-CM | POA: Diagnosis not present

## 2022-02-02 ENCOUNTER — Ambulatory Visit (INDEPENDENT_AMBULATORY_CARE_PROVIDER_SITE_OTHER): Payer: Medicare Other | Admitting: Pulmonary Disease

## 2022-02-02 ENCOUNTER — Encounter (HOSPITAL_BASED_OUTPATIENT_CLINIC_OR_DEPARTMENT_OTHER): Payer: Self-pay | Admitting: Pulmonary Disease

## 2022-02-02 ENCOUNTER — Other Ambulatory Visit (HOSPITAL_BASED_OUTPATIENT_CLINIC_OR_DEPARTMENT_OTHER): Payer: Self-pay

## 2022-02-02 VITALS — BP 110/84 | HR 91 | Temp 97.8°F | Ht 71.0 in | Wt 191.0 lb

## 2022-02-02 DIAGNOSIS — J479 Bronchiectasis, uncomplicated: Secondary | ICD-10-CM | POA: Diagnosis not present

## 2022-02-02 DIAGNOSIS — J449 Chronic obstructive pulmonary disease, unspecified: Secondary | ICD-10-CM

## 2022-02-02 DIAGNOSIS — J439 Emphysema, unspecified: Secondary | ICD-10-CM

## 2022-02-02 DIAGNOSIS — I2510 Atherosclerotic heart disease of native coronary artery without angina pectoris: Secondary | ICD-10-CM | POA: Diagnosis not present

## 2022-02-02 LAB — PULMONARY FUNCTION TEST
FEF 25-75 Post: 1.75 L/sec
FEF 25-75 Pre: 1.54 L/sec
FEF2575-%Change-Post: 13 %
FEF2575-%Pred-Post: 93 %
FEF2575-%Pred-Pre: 82 %
FEV1-%Change-Post: 2 %
FEV1-%Pred-Post: 83 %
FEV1-%Pred-Pre: 80 %
FEV1-Post: 2.31 L
FEV1-Pre: 2.25 L
FEV1FVC-%Change-Post: 4 %
FEV1FVC-%Pred-Pre: 102 %
FEV6-%Change-Post: -1 %
FEV6-%Pred-Post: 82 %
FEV6-%Pred-Pre: 83 %
FEV6-Post: 3.02 L
FEV6-Pre: 3.08 L
FEV6FVC-%Change-Post: 0 %
FEV6FVC-%Pred-Post: 107 %
FEV6FVC-%Pred-Pre: 107 %
FVC-%Change-Post: -1 %
FVC-%Pred-Post: 76 %
FVC-%Pred-Pre: 78 %
FVC-Post: 3.03 L
FVC-Pre: 3.08 L
Post FEV1/FVC ratio: 76 %
Post FEV6/FVC ratio: 100 %
Pre FEV1/FVC ratio: 73 %
Pre FEV6/FVC Ratio: 100 %

## 2022-02-02 NOTE — Assessment & Plan Note (Addendum)
He is on a regimen of Asmanex and albuterol which we will continue Physiology is more of asthma rather than COPD

## 2022-02-02 NOTE — Patient Instructions (Addendum)
X sputum afb  X spirometry   You have Bronchiectasis We will look for Mycobacterium avium (MAC )  Cancel appt with TP

## 2022-02-02 NOTE — Patient Instructions (Signed)
Pre/Post Spirometry Performed Today. 

## 2022-02-02 NOTE — Progress Notes (Signed)
   Subjective:    Patient ID: Nathaniel Leach, male    DOB: [birth year], 81 y.o.   MRN: [id number]  HPI  81 yo remote smoker for follow-up of bronchiectasis and asthma 08/2019 sputum culture positive for Pseudomonas sensitive to Cipro/levofloxacin  PFT showed airway obstruction >> reversed on follow-up spirometry. 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 fib  on anticoagulation  Chief Complaint  Patient presents with   Follow-up    Follow up on Ct Results. No new issues since LOV.   He states that he is breathing well .  He is compliant with hypertonic saline nebs and vest treatment.  He coughs up small amount of ivory colored sputum. We reviewed CT chest which shows slight worsening of bronchiectasis compared to 2021 and minimal atelectasis of right middle lobe which is likely due to mucous plugging. Spirometry shows preserved lung function  Significant tests/ events reviewed  Spirometry 01/2022 -no airway obstruction, FEV1 80% Spirometry 12/2019 >> no airway obstruction, ratio 73, FEV1 85%   Sputum AFB 11/16/2019  Neg .  Sputum culture 11/16/2019 + for Pseudomonas-pansensitive  HRCT 01/2022 >> Fairly diffuse peribronchial thickening, scattered endobronchial debris/mucoid impaction, peribronchovascular nodularity and areas of consolidation, progressive from 09/2019 ,New volume loss in the lateral segment right middle lobe    HRCT 09/2019 > Mild centrilobular and paraseptal emphysema with prominent diffuse bronchial wall thickening, suggesting COPD. 2. Mild diffuse cylindrical bronchiectasis with moderate patchy tree-in-bud opacities most prominent in the dependent lower lobes. Findings suggest chronic/recurrent bronchiolitis such as due to recurrent aspiration or atypical mycobacterial  infection (MAI). 3. Resolved bilateral lower lobe pneumonia seen on 08/26/2019    PFTs 10/2019 moderate airway obstruction, ratio 57, FEV1 1.39/48%, FVC 60%, no bronchodilator response, and DLCO 69%, TLC normal   CT angiogram chest 08/26/2019 patchy predominantly lower lobe opacities bilateral   CT chest with contrast 05/2019 moderate hiatal hernia small clustered nodules left lower lobe, mild scarring left upper lobe and lingula, mild bronchiectasis to my review   CT chest/abdomen/pelvis 04/2013 clear lungs    Review of Systems neg for any significant sore throat, dysphagia, itching, sneezing, nasal congestion or excess/ purulent secretions, fever, chills, sweats, unintended wt loss, pleuritic or exertional cp, hempoptysis, orthopnea pnd or change in chronic leg swelling. Also denies presyncope, palpitations, heartburn, abdominal pain, nausea, vomiting, diarrhea or change in bowel or urinary habits, dysuria,hematuria, rash, arthralgias, visual complaints, headache, numbness weakness or ataxia.     Objective:   Physical Exam  Gen. Pleasant, elderly, well-nourished, in no distress ENT - no thrush, no pallor/icterus,no post nasal drip Neck: No JVD, no thyromegaly, no carotid bruits Lungs: no use of accessory muscles, no dullness to percussion, clear without rales or rhonchi  Cardiovascular: Rhythm regular, heart sounds  normal, no murmurs or gallops, no peripheral edema Musculoskeletal: No deformities, no cyanosis or clubbing         Assessment & Plan:

## 2022-02-02 NOTE — Assessment & Plan Note (Addendum)
Bronchiectasis appears slightly worse compared to 2021.  Fortunately lung function is preserved. We will obtain sputum for AFB since he does expectorate.  Testing for Aspergillus was negative, I doubt ABPA here If he is unable to obtain then we may have to proceed with bronchoscopy.  I discussed this possibility with him today. We again discussed that airway clearance is the key and I believe that is why his lung function has been preserved so far because he is doing well with his clearance regimen of saline nebs followed by vest

## 2022-02-02 NOTE — Progress Notes (Signed)
Pre/Post Spirometry Performed Today. 

## 2022-02-06 ENCOUNTER — Other Ambulatory Visit: Payer: Medicare Other

## 2022-02-06 ENCOUNTER — Other Ambulatory Visit: Payer: Self-pay

## 2022-02-06 DIAGNOSIS — J479 Bronchiectasis, uncomplicated: Secondary | ICD-10-CM

## 2022-02-09 LAB — RESPIRATORY CULTURE OR RESPIRATORY AND SPUTUM CULTURE
MICRO NUMBER:: 14303167
SPECIMEN QUALITY:: ADEQUATE

## 2022-02-14 ENCOUNTER — Telehealth: Payer: Self-pay

## 2022-02-14 ENCOUNTER — Other Ambulatory Visit: Payer: Self-pay

## 2022-02-14 MED ORDER — SULFAMETHOXAZOLE-TRIMETHOPRIM 800-160 MG PO TABS: 1.0000 | ORAL_TABLET | Freq: Two times a day (BID) | ORAL | 0 refills | Status: DC

## 2022-02-14 NOTE — Telephone Encounter (Signed)
Called Pt to give results of Sputum collection. Sent a Rx to [facility] in [place] on [address] for Bactrim per Dr Elsworth Soho. Pt stated understanding and nothing further needed.

## 2022-03-21 DIAGNOSIS — L821 Other seborrheic keratosis: Secondary | ICD-10-CM | POA: Diagnosis not present

## 2022-04-04 DIAGNOSIS — R0781 Pleurodynia: Secondary | ICD-10-CM | POA: Diagnosis not present

## 2022-04-24 ENCOUNTER — Ambulatory Visit: Payer: Medicare Other | Admitting: Adult Health

## 2022-05-09 ENCOUNTER — Encounter (HOSPITAL_BASED_OUTPATIENT_CLINIC_OR_DEPARTMENT_OTHER): Payer: Self-pay | Admitting: Pulmonary Disease

## 2022-05-09 ENCOUNTER — Ambulatory Visit (INDEPENDENT_AMBULATORY_CARE_PROVIDER_SITE_OTHER): Payer: Medicare Other | Admitting: Pulmonary Disease

## 2022-05-09 VITALS — BP 122/64 | HR 92 | Temp 97.6°F | Ht 71.0 in | Wt 190.0 lb

## 2022-05-09 DIAGNOSIS — J45998 Other asthma: Secondary | ICD-10-CM | POA: Diagnosis not present

## 2022-05-09 DIAGNOSIS — J479 Bronchiectasis, uncomplicated: Secondary | ICD-10-CM | POA: Diagnosis not present

## 2022-05-09 NOTE — Patient Instructions (Addendum)
X sputum afb  Airway clearance measures-very important for you Mucinex, saline nebulizer, vest, flutter valve

## 2022-05-09 NOTE — Assessment & Plan Note (Signed)
Would prefer for him to be on a steroid/LABA regimen but he gets Asmanex and albuterol from the [place] for free and will continue him on this regimen. We discussed action plan for asthma.  Lung function is maintained currently

## 2022-05-09 NOTE — Assessment & Plan Note (Addendum)
We once again discussed his airway clearance measures is very important for him.  Use of Mucinex, saline nebs, vest and flutter valve to clear his airways. We discussed that he is colonized with Pseudomonas and staph and likely to get superinfection with these organisms Cherina keep his airways clear. Lung function is maintained 80% which is reassuring We will try to obtain sputum AFB for mycobacteria, unable to obtain this in the past

## 2022-05-09 NOTE — Progress Notes (Signed)
   Subjective:    Patient ID: Nathaniel Leach, male    DOB: April 03, 1940, 82 y.o.   MRN: [id number]  HPI  82 yo remote smoker for follow-up of bronchiectasis and asthma 08/2019 sputum culture positive for Pseudomonas sensitive to Cipro/levofloxacin  PFT showed airway obstruction >> reversed on follow-up spirometry. 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 fib  on anticoagulation   Chief Complaint  Patient presents with   Follow-up    Pt states he has been doing good since last visit and denies any complaints.   38-month follow-up visit On his last visit we obtained sputum culture which showed MSSA, we treated him for Bactrim for 7 days and necessarily help. He now reports minimal sputum production, mostly clear occasionally yellow. He is mostly compliant with airway clearance measures including vest and saline nebs except when he is out of town. Breathing is doing better and he feels like he can take a deep breath now.  He remains on a regimen of Asmanex and albuterol which she obtains from the [place] Which reviewed spirometry results   Significant tests/ events reviewed   Spirometry 01/2022 -no airway obstruction, FEV1 80% Spirometry 12/2019 >> no airway obstruction, ratio 73, FEV1 85%  11/2021 Aspergillus IgE 0  01/2022 sputum cx >>> MSSA Sputum AFB 11/16/2019  Neg .  Sputum culture 11/16/2019 + for Pseudomonas-pansensitive   HRCT 01/2022 >> Fairly diffuse peribronchial thickening, scattered endobronchial debris/mucoid impaction, peribronchovascular nodularity and areas of consolidation, progressive from 09/2019 ,New volume loss in the lateral segment right middle lobe      HRCT 09/2019 > Mild centrilobular and paraseptal emphysema with prominent diffuse bronchial wall thickening,  suggesting COPD. 2. Mild diffuse cylindrical bronchiectasis with moderate patchy tree-in-bud opacities most prominent in the dependent lower lobes. Findings suggest chronic/recurrent bronchiolitis such as due to recurrent aspiration or atypical mycobacterial infection (MAI). 3. Resolved bilateral lower lobe pneumonia seen on 08/26/2019    PFTs 10/2019 moderate airway obstruction, ratio 57, FEV1 1.39/48%, FVC 60%, no bronchodilator response, and DLCO 69%, TLC normal   CT angiogram chest 08/26/2019 patchy predominantly lower lobe opacities bilateral   CT chest with contrast 05/2019 moderate hiatal hernia small clustered nodules left lower lobe, mild scarring left upper lobe and lingula, mild bronchiectasis to my review   CT chest/abdomen/pelvis 04/2013 clear lungs   Review of Systems neg for any significant sore throat, dysphagia, itching, sneezing, nasal congestion or excess/ purulent secretions, fever, chills, sweats, unintended wt loss, pleuritic or exertional cp, hempoptysis, orthopnea pnd or change in chronic leg swelling. Also denies presyncope, palpitations, heartburn, abdominal pain, nausea, vomiting, diarrhea or change in bowel or urinary habits, dysuria,hematuria, rash, arthralgias, visual complaints, headache, numbness weakness or ataxia.     Objective:   Physical Exam  Gen. Pleasant, well-nourished, in no distress ENT - no thrush, no pallor/icterus,no post nasal drip Neck: No JVD, no thyromegaly, no carotid bruits Lungs: no use of accessory muscles, no dullness to percussion, clear without rales or rhonchi  Cardiovascular: Rhythm regular, heart sounds  normal, no murmurs or gallops, no peripheral edema Musculoskeletal: No deformities, no cyanosis or clubbing        Assessment & Plan:

## 2022-05-22 DIAGNOSIS — E119 Type 2 diabetes mellitus without complications: Secondary | ICD-10-CM | POA: Diagnosis not present

## 2022-05-23 DIAGNOSIS — J479 Bronchiectasis, uncomplicated: Secondary | ICD-10-CM | POA: Diagnosis not present

## 2022-06-16 DIAGNOSIS — L209 Atopic dermatitis, unspecified: Secondary | ICD-10-CM | POA: Diagnosis not present

## 2022-07-13 LAB — MTB-RIF NAA WITHOUT AFB CULT.

## 2022-07-13 LAB — NOTE:

## 2022-07-13 LAB — MTB-RIF NAA W AFB CULT, NON-SPUTUM: Acid Fast Culture: NEGATIVE

## 2022-07-13 LAB — REFERENCE MICRO PROBLEM TEST

## 2022-07-28 DEATH — deceased

## 2022-09-06 ENCOUNTER — Ambulatory Visit: Payer: Medicare Other | Admitting: Adult Health
